# Patient Record
Sex: Male | Born: 1960 | Race: White | Hispanic: No | Marital: Single | Smoking: Current every day smoker
Health system: Southern US, Community
[De-identification: ages and names within clinical notes are randomized; demographics above are authoritative.]

## PROBLEM LIST (undated history)

## (undated) DIAGNOSIS — F1410 Cocaine abuse, uncomplicated: Secondary | ICD-10-CM

## (undated) DIAGNOSIS — Z720 Tobacco use: Secondary | ICD-10-CM

## (undated) HISTORY — PX: ABDOMINAL SURGERY: SHX537

---

## 2004-10-12 ENCOUNTER — Emergency Department: Payer: Self-pay | Admitting: Emergency Medicine

## 2005-06-09 ENCOUNTER — Emergency Department: Payer: Self-pay | Admitting: Emergency Medicine

## 2010-05-18 ENCOUNTER — Emergency Department: Payer: Self-pay | Admitting: Emergency Medicine

## 2012-09-06 ENCOUNTER — Emergency Department: Payer: Self-pay | Admitting: Emergency Medicine

## 2013-04-17 ENCOUNTER — Emergency Department: Payer: Self-pay | Admitting: Emergency Medicine

## 2015-10-08 ENCOUNTER — Encounter: Payer: Self-pay | Admitting: Emergency Medicine

## 2015-10-08 ENCOUNTER — Emergency Department
Admission: EM | Admit: 2015-10-08 | Discharge: 2015-10-08 | Disposition: A | Discharge status: Home / Self Care | Payer: Self-pay | Attending: Emergency Medicine | Admitting: Emergency Medicine

## 2015-10-08 DIAGNOSIS — F17200 Nicotine dependence, unspecified, uncomplicated: Secondary | ICD-10-CM | POA: Insufficient documentation

## 2015-10-08 DIAGNOSIS — K029 Dental caries, unspecified: Secondary | ICD-10-CM | POA: Insufficient documentation

## 2015-10-08 DIAGNOSIS — K047 Periapical abscess without sinus: Secondary | ICD-10-CM

## 2015-10-08 DIAGNOSIS — K0889 Other specified disorders of teeth and supporting structures: Secondary | ICD-10-CM

## 2015-10-08 MED ORDER — OXYCODONE-ACETAMINOPHEN 5-325 MG PO TABS
2.0000 | ORAL_TABLET | Freq: Once | ORAL | Status: AC
  Administered 2015-10-08: 2 via ORAL
  Filled 2015-10-08: qty 2

## 2015-10-08 MED ORDER — AMOXICILLIN 500 MG PO TABS: 500.0000 mg | ORAL_TABLET | Freq: Two times a day (BID) | ORAL | 0 refills | Status: DC

## 2015-10-08 MED ORDER — LIDOCAINE VISCOUS 2 % MT SOLN: 20.0000 mL | OROMUCOSAL | 0 refills | Status: DC | PRN

## 2015-10-08 MED ORDER — LIDOCAINE VISCOUS 2 % MT SOLN
15.0000 mL | Freq: Once | OROMUCOSAL | Status: AC
  Administered 2015-10-08: 15 mL via OROMUCOSAL
  Filled 2015-10-08: qty 15

## 2015-10-08 MED ORDER — OXYCODONE-ACETAMINOPHEN 5-325 MG PO TABS: 1.0000 | ORAL_TABLET | ORAL | 0 refills | Status: DC | PRN

## 2015-10-08 MED ORDER — AMOXICILLIN 500 MG PO CAPS
500.0000 mg | ORAL_CAPSULE | Freq: Once | ORAL | Status: AC
  Administered 2015-10-08: 500 mg via ORAL
  Filled 2015-10-08: qty 1

## 2015-10-08 NOTE — ED Triage Notes (Signed)
Pt c/o abscess tooth since last night, broken teeth noted, pt missing teeth. Pt denies difficulty swallowing or chewing.

## 2015-10-08 NOTE — ED Notes (Addendum)
Pt states "I have an abscessed tooth." Pt states swollen face x 24 hours. Has not seen a dentist. Lower R sided pain. States has taken BC powders, orajel, tylneol with no relief.

## 2015-10-08 NOTE — ED Provider Notes (Signed)
[facility] Emergency Department Provider Note  ____________________________________________  Time seen: Approximately 8:41 PM  I have reviewed the triage vital signs and the nursing notes.   HISTORY  Chief Complaint Dental Pain    HPI Jeffrey Ramsey is a 55 y.o. male presents for evaluation of dental pain and swelling to the right front lower teeth. Discussed pain is 10 over 10.   History reviewed. No pertinent past medical history.  There are no active problems to display for this patient.   Past Surgical History:  Procedure Laterality Date  . ABDOMINAL SURGERY      Prior to Admission medications   Medication Sig Start Date End Date Taking? Authorizing Provider  amoxicillin (AMOXIL) 500 MG tablet Take 1 tablet (500 mg total) by mouth 2 (two) times daily. 10/08/15   Charmayne Sheer Hazim Treadway, PA-C  lidocaine (XYLOCAINE) 2 % solution Use as directed 20 mLs in the mouth or throat as needed for mouth pain. 10/08/15   Evangeline Dakin, PA-C  oxyCODONE-acetaminophen (ROXICET) 5-325 MG tablet Take 1-2 tablets by mouth every 4 (four) hours as needed for severe pain. 10/08/15   Evangeline Dakin, PA-C    Allergies Review of patient's allergies indicates no known allergies.  No family history on file.  Social History Social History  Substance Use Topics  . Smoking status: Current Every Day Smoker    Packs/day: 1.00  . Smokeless tobacco: Never Used  . Alcohol use No    Review of Systems Constitutional: No fever/chills ENT: No sore throat.Positive for dental pain. Cardiovascular: Denies chest pain. Respiratory: Denies shortness of breath. Skin: Negative for rash. Neurological: Negative for headaches, focal weakness or numbness.  10-point ROS otherwise negative.  ____________________________________________   PHYSICAL EXAM:  VITAL SIGNS: ED Triage Vitals  Enc Vitals Group     BP 10/08/15 1953 125/74     Pulse Rate 10/08/15 1953 (!) 103     Resp 10/08/15  1953 18     Temp 10/08/15 1953 98.3 F (36.8 C)     Temp Source 10/08/15 1953 Oral     SpO2 10/08/15 1953 98 %     Weight 10/08/15 1953 175 lb (79.4 kg)     Height 10/08/15 1953 5\' 8"  (1.727 m)     Head Circumference --      Peak Flow --      Pain Score 10/08/15 1954 10     Pain Loc --      Pain Edu? --      Excl. in GC? --     Constitutional: Alert and oriented. Well appearing and in no acute distress. Mouth/Throat: Mucous membranes are moist.  Oropharynx non-erythematous.Obvious dental caries noted with rotted teeth swollen gums on the right lower incisors.  Neck: No stridor.  Supple full range of motion with no cervical adenopathy noted. Cardiovascular: Normal rate, regular rhythm. Grossly normal heart sounds.  Good peripheral circulation. Respiratory: Normal respiratory effort.  No retractions. Lungs CTAB. Neurologic:  Normal speech and language. No gross focal neurologic deficits are appreciated. No gait instability. Skin:  Skin is warm, dry and intact. No rash noted. Psychiatric: Mood and affect are normal. Speech and behavior are normal.  ____________________________________________   LABS (all labs ordered are listed, but only abnormal results are displayed)  Labs Reviewed - No data to display ____________________________________________  EKG   ____________________________________________  RADIOLOGY   ____________________________________________   PROCEDURES  Procedure(s) performed: None  Critical Care performed: No  ____________________________________________   INITIAL  IMPRESSION / ASSESSMENT AND PLAN / ED COURSE  Pertinent labs & imaging results that were available during my care of the patient were reviewed by me and considered in my medical decision making (see chart for details).  Dental caries/abscess. Rx given for amoxicillin/Percocet/Viscous Lidocaine. List of local dental providers given. Patient was looked up in [place] controlled  substance registry with no history noted.  Clinical Course    ____________________________________________   FINAL CLINICAL IMPRESSION(S) / ED DIAGNOSES  Final diagnoses:  Pain, dental  Infected dental caries     This chart was dictated using voice recognition software/Dragon. Despite best efforts to proofread, errors can occur which can change the meaning. Any change was purely unintentional.    Evangeline Dakin, PA-C 10/08/15 4132    Phineas Semen, MD 10/08/15 2156

## 2017-08-29 ENCOUNTER — Emergency Department: Payer: Self-pay

## 2017-08-29 ENCOUNTER — Other Ambulatory Visit: Payer: Self-pay

## 2017-08-29 ENCOUNTER — Encounter: Payer: Self-pay | Admitting: Emergency Medicine

## 2017-08-29 DIAGNOSIS — M25511 Pain in right shoulder: Secondary | ICD-10-CM | POA: Insufficient documentation

## 2017-08-29 DIAGNOSIS — W172XXA Fall into hole, initial encounter: Secondary | ICD-10-CM | POA: Insufficient documentation

## 2017-08-29 DIAGNOSIS — W228XXA Striking against or struck by other objects, initial encounter: Secondary | ICD-10-CM | POA: Insufficient documentation

## 2017-08-29 DIAGNOSIS — Y990 Civilian activity done for income or pay: Secondary | ICD-10-CM | POA: Insufficient documentation

## 2017-08-29 DIAGNOSIS — R55 Syncope and collapse: Secondary | ICD-10-CM | POA: Insufficient documentation

## 2017-08-29 DIAGNOSIS — M7918 Myalgia, other site: Secondary | ICD-10-CM | POA: Insufficient documentation

## 2017-08-29 DIAGNOSIS — H9313 Tinnitus, bilateral: Secondary | ICD-10-CM | POA: Insufficient documentation

## 2017-08-29 DIAGNOSIS — H9202 Otalgia, left ear: Secondary | ICD-10-CM | POA: Insufficient documentation

## 2017-08-29 DIAGNOSIS — F17210 Nicotine dependence, cigarettes, uncomplicated: Secondary | ICD-10-CM | POA: Insufficient documentation

## 2017-08-29 DIAGNOSIS — H538 Other visual disturbances: Secondary | ICD-10-CM | POA: Insufficient documentation

## 2017-08-29 DIAGNOSIS — Y9389 Activity, other specified: Secondary | ICD-10-CM | POA: Insufficient documentation

## 2017-08-29 DIAGNOSIS — S0990XA Unspecified injury of head, initial encounter: Secondary | ICD-10-CM | POA: Insufficient documentation

## 2017-08-29 DIAGNOSIS — R110 Nausea: Secondary | ICD-10-CM | POA: Insufficient documentation

## 2017-08-29 DIAGNOSIS — Y9289 Other specified places as the place of occurrence of the external cause: Secondary | ICD-10-CM | POA: Insufficient documentation

## 2017-08-29 NOTE — ED Triage Notes (Addendum)
Pt presents to ED post head injury. Pt states around 1600 he was struck in the head with a fiberglass instrument used at his job. Pt states it hit him on the left side of his head and knocked him into a hole with +loc. Pt states initially his left ear was ringing and his vision was blurry. Pt states this has resolved. Abrasion noted to left side of pt head. Bleeding controlled. Pt talkative with clear speech and able to answer questions without difficulty. No distress noted.

## 2017-08-30 ENCOUNTER — Emergency Department: Payer: Self-pay

## 2017-08-30 ENCOUNTER — Emergency Department
Admission: EM | Admit: 2017-08-30 | Discharge: 2017-08-30 | Disposition: A | Discharge status: Home / Self Care | Payer: Self-pay | Attending: Emergency Medicine | Admitting: Emergency Medicine

## 2017-08-30 DIAGNOSIS — T148XXA Other injury of unspecified body region, initial encounter: Secondary | ICD-10-CM

## 2017-08-30 DIAGNOSIS — M7918 Myalgia, other site: Secondary | ICD-10-CM

## 2017-08-30 DIAGNOSIS — W19XXXA Unspecified fall, initial encounter: Secondary | ICD-10-CM

## 2017-08-30 DIAGNOSIS — R52 Pain, unspecified: Secondary | ICD-10-CM

## 2017-08-30 DIAGNOSIS — S0990XA Unspecified injury of head, initial encounter: Secondary | ICD-10-CM

## 2017-08-30 MED ORDER — TRAMADOL HCL 50 MG PO TABS
50.0000 mg | ORAL_TABLET | Freq: Once | ORAL | Status: AC
  Administered 2017-08-30: 50 mg via ORAL

## 2017-08-30 MED ORDER — TRAMADOL HCL 50 MG PO TABS: 50.0000 mg | ORAL_TABLET | Freq: Four times a day (QID) | ORAL | 0 refills | Status: DC | PRN

## 2017-08-30 MED ORDER — KETOROLAC TROMETHAMINE 60 MG/2ML IM SOLN
INTRAMUSCULAR | Status: AC
  Filled 2017-08-30: qty 2

## 2017-08-30 MED ORDER — KETOROLAC TROMETHAMINE 60 MG/2ML IM SOLN
60.0000 mg | Freq: Once | INTRAMUSCULAR | Status: AC
  Administered 2017-08-30: 60 mg via INTRAMUSCULAR

## 2017-08-30 MED ORDER — TRAMADOL HCL 50 MG PO TABS
ORAL_TABLET | ORAL | Status: AC
  Filled 2017-08-30: qty 1

## 2017-08-30 NOTE — Discharge Instructions (Addendum)
Please follow up with the acute care clinic for further evaluation of your symptoms. Please return with any worsened condition or any other concerns.

## 2017-08-30 NOTE — ED Provider Notes (Signed)
[facility] Emergency Department Provider Note   ____________________________________________   First MD Initiated Contact with Patient 08/30/17 [phone]     (approximate)  I have reviewed the triage vital signs and the nursing notes.   HISTORY  Chief Complaint Head Injury    HPI Jeffrey Ramsey is a 57 y.o. male who comes into the hospital today with a head injury.  The patient states that he was hit in the head at work earlier today.  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  The patient fell into the ditch and passed out.  He states that he was out for short amount of time but when he came to everything was gray and his vision was blurred.  He had some nausea and some ringing in his ears initially but that has improved.  The patient states that he has some left ear pain and right shoulder pain.  He also has an abrasion to his left scalp and rates his pain a 10 out of 10 in intensity.  He has some jaw pain but went home initially and showered.  He came in as his pain was is getting worse.  This occurred around 3:30 PM.  The patient is here today for evaluation.   History reviewed. No pertinent past medical history.  There are no active problems to display for this patient.   Past Surgical History:  Procedure Laterality Date  . ABDOMINAL SURGERY      Prior to Admission medications   Medication Sig Start Date End Date Taking? Authorizing Provider  amoxicillin (AMOXIL) 500 MG tablet Take 1 tablet (500 mg total) by mouth 2 (two) times daily. 10/08/15   Beers, Charmayne Sheer, PA-C  lidocaine (XYLOCAINE) 2 % solution Use as directed 20 mLs in the mouth or throat as needed for mouth pain. 10/08/15   Beers, Charmayne Sheer, PA-C  oxyCODONE-acetaminophen (ROXICET) 5-325 MG tablet Take 1-2 tablets by mouth every 4 (four) hours as needed for severe pain. 10/08/15   Beers, Charmayne Sheer, PA-C  traMADol (ULTRAM) 50 MG tablet  Take 1 tablet (50 mg total) by mouth every 6 (six) hours as needed. 08/30/17   Rebecka Apley, MD    Allergies Penicillins  No family history on file.  Social History Social History   Tobacco Use  . Smoking status: Current Every Day Smoker    Packs/day: 1.00    Types: Cigarettes  . Smokeless tobacco: Never Used  Substance Use Topics  . Alcohol use: No  . Drug use: Never    Review of Systems  Constitutional: No fever/chills Eyes: No visual changes. ENT: Jaw pain. Cardiovascular: Denies chest pain. Respiratory: Denies shortness of breath. Gastrointestinal: No abdominal pain.  No nausea, no vomiting.  No diarrhea.  No constipation. Genitourinary: Negative for dysuria. Musculoskeletal: Negative for back pain. Skin: Abrasion to left scalp Neurological: Head pain   ____________________________________________   PHYSICAL EXAM:  VITAL SIGNS: ED Triage Vitals  Enc Vitals Group     BP 08/29/17 2110 130/78     Pulse Rate 08/29/17 2110 96     Resp 08/29/17 2110 18     Temp 08/29/17 2110 97.8 F (36.6 C)     Temp Source 08/29/17 2110 Oral     SpO2 08/29/17 2110 96 %     Weight 08/29/17 2112 175 lb (79.4 kg)     Height 08/29/17 2112  5\' 9"  (1.753 m)     Head Circumference --      Peak Flow --      Pain Score 08/29/17 2110 8     Pain Loc --      Pain Edu? --      Excl. in GC? --     Constitutional: Alert and oriented. Well appearing and in mild distress. Eyes: Conjunctivae are normal. PERRL. EOMI. Head: Abrasion to left scalp.  Bruising to left tragus Nose: No congestion/rhinnorhea. Mouth/Throat: Mucous membranes are moist.  Oropharynx non-erythematous. Neck: No cervical spine tenderness to palpation.  Tenderness to palpation of left jaw  Cardiovascular: Normal rate, regular rhythm. Grossly normal heart sounds.  Good peripheral circulation. Respiratory: Normal respiratory effort.  No retractions. Lungs CTAB. Gastrointestinal: Soft and nontender. No distention.   Positive bowel sounds Musculoskeletal: Mild tenderness to palpation of right shoulder no significant pain with range of motion Neurologic:  Normal speech and language.  Skin:  Skin is warm, dry and intact. Marland Kitchen. Psychiatric: Mood and affect are normal.   ____________________________________________   LABS (all labs ordered are listed, but only abnormal results are displayed)  Labs Reviewed - No data to display ____________________________________________  EKG  none ____________________________________________  RADIOLOGY  ED MD interpretation:   CT head: No acute intracranial pathology  CT maxillofacial: No acute traumatic facial bone fractures  DG cervical spine: No acute traumatic cervical spine pathology by radiograph  DG right shoulder: Negative  Official radiology report(s): Dg Cervical Spine 2-3 Views  Result Date: 08/30/2017 CLINICAL DATA:  57 year old male with fall and neck pain. EXAM: CERVICAL SPINE - 2-3 VIEW COMPARISON:  None. FINDINGS: There is no acute fracture or subluxation of the cervical spine. There multilevel degenerative changes primarily at C4-C5 and C5-C6. The visualized posterior elements and the odontoid appear intact. There is anatomic alignment of the lateral masses of C1 and C2. The soft tissues appear unremarkable. IMPRESSION: No acute/traumatic cervical spine pathology by radiograph. Electronically Signed   By: Elgie CollardArash  Radparvar M.D.   On: 08/30/2017 02:21   Dg Shoulder Right  Result Date: 08/30/2017 CLINICAL DATA:  57 year old male with trauma to the right shoulder. EXAM: RIGHT SHOULDER - 2+ VIEW COMPARISON:  None. FINDINGS: There is no evidence of fracture or dislocation. There is no evidence of arthropathy or other focal bone abnormality. Soft tissues are unremarkable. IMPRESSION: Negative. Electronically Signed   By: Elgie CollardArash  Radparvar M.D.   On: 08/30/2017 02:19   Ct Head Wo Contrast  Result Date: 08/29/2017 CLINICAL DATA:  57 year old male with  head trauma. EXAM: CT HEAD WITHOUT CONTRAST TECHNIQUE: Contiguous axial images were obtained from the base of the skull through the vertex without intravenous contrast. COMPARISON:  None. FINDINGS: Brain: No evidence of acute infarction, hemorrhage, hydrocephalus, extra-axial collection or mass lesion/mass effect. Vascular: No hyperdense vessel or unexpected calcification. Skull: Normal. Negative for fracture or focal lesion. Sinuses/Orbits: There is mild mucoperiosteal thickening of paranasal sinuses. No air-fluid levels. The mastoid air cells are clear. Other: None IMPRESSION: No acute intracranial pathology. Electronically Signed   By: Elgie CollardArash  Radparvar M.D.   On: 08/29/2017 22:05   Ct Maxillofacial Wo Contrast  Result Date: 08/30/2017 CLINICAL DATA:  57 year old male with trauma to the face. EXAM: CT MAXILLOFACIAL WITHOUT CONTRAST TECHNIQUE: Multidetector CT imaging of the maxillofacial structures was performed. Multiplanar CT image reconstructions were also generated. COMPARISON:  None. FINDINGS: Osseous: No acute fracture or mandibular dislocation. There multiple dental caries. Orbits: Negative. No traumatic or inflammatory finding. Sinuses: Diffuse  mucoperiosteal thickening of paranasal sinuses. No air-fluid levels. The mastoid air cells are clear as visualized. Soft tissues: Negative. Limited intracranial: No significant or unexpected finding. IMPRESSION: No acute/traumatic facial bone fractures. Electronically Signed   By: Elgie Collard M.D.   On: 08/30/2017 02:28    ____________________________________________   PROCEDURES  Procedure(s) performed: None  Procedures  Critical Care performed: No  ____________________________________________   INITIAL IMPRESSION / ASSESSMENT AND PLAN / ED COURSE  As part of my medical decision making, I reviewed the following data within the electronic MEDICAL RECORD NUMBER Notes from prior ED visits and [place] Controlled Substance Database   This is a  57 year old male who comes into the hospital today after being hit in the head at work.  He still having some pain so his concern is for any acute injury.  We did send the patient for CT scan of his head and maxillofacial area.  The patient also had an x-ray of his right shoulder and cervical spine.  The patient's imaging studies are unremarkable.  He did receive a shot of Toradol and some tramadol for pain.  He will be discharged home.  We did place some Steri-Strips over his abrasion.  He has no other complaints at this time and he will be discharged home.      ____________________________________________   FINAL CLINICAL IMPRESSION(S) / ED DIAGNOSES  Final diagnoses:  Injury of head, initial encounter  Abrasion  Musculoskeletal pain     ED Discharge Orders        Ordered    traMADol (ULTRAM) 50 MG tablet  Every 6 hours PRN     08/30/17 0233       Note:  This document was prepared using Dragon voice recognition software and may include unintentional dictation errors.    Rebecka Apley, MD 08/30/17 [phone]

## 2017-08-30 NOTE — ED Notes (Signed)
Pt was at work and was hit by a stick instrument that was in the hole they were digging. Ringing in his left ear and a cut at his temple and a stiff jaw. Also fell onto R shoulder and it is stiff and the muscles are sore.

## 2020-08-14 ENCOUNTER — Other Ambulatory Visit: Payer: Self-pay

## 2020-08-14 ENCOUNTER — Emergency Department (HOSPITAL_COMMUNITY): Payer: No Typology Code available for payment source

## 2020-08-14 ENCOUNTER — Inpatient Hospital Stay (HOSPITAL_COMMUNITY)
Admission: EM | Admit: 2020-08-14 | Discharge: 2020-08-19 | DRG: 982 | Disposition: A | Discharge status: Home / Self Care | Payer: No Typology Code available for payment source | Attending: Surgery | Admitting: Surgery

## 2020-08-14 DIAGNOSIS — S32810A Multiple fractures of pelvis with stable disruption of pelvic ring, initial encounter for closed fracture: Secondary | ICD-10-CM | POA: Diagnosis present

## 2020-08-14 DIAGNOSIS — Y92410 Unspecified street and highway as the place of occurrence of the external cause: Secondary | ICD-10-CM

## 2020-08-14 DIAGNOSIS — S82042A Displaced comminuted fracture of left patella, initial encounter for closed fracture: Secondary | ICD-10-CM

## 2020-08-14 DIAGNOSIS — R319 Hematuria, unspecified: Secondary | ICD-10-CM | POA: Diagnosis present

## 2020-08-14 DIAGNOSIS — T1490XA Injury, unspecified, initial encounter: Secondary | ICD-10-CM | POA: Diagnosis not present

## 2020-08-14 DIAGNOSIS — F17210 Nicotine dependence, cigarettes, uncomplicated: Secondary | ICD-10-CM | POA: Diagnosis present

## 2020-08-14 DIAGNOSIS — S2249XA Multiple fractures of ribs, unspecified side, initial encounter for closed fracture: Secondary | ICD-10-CM

## 2020-08-14 DIAGNOSIS — Z419 Encounter for procedure for purposes other than remedying health state, unspecified: Secondary | ICD-10-CM

## 2020-08-14 DIAGNOSIS — S2241XA Multiple fractures of ribs, right side, initial encounter for closed fracture: Principal | ICD-10-CM | POA: Diagnosis present

## 2020-08-14 DIAGNOSIS — S32119A Unspecified Zone I fracture of sacrum, initial encounter for closed fracture: Secondary | ICD-10-CM | POA: Diagnosis present

## 2020-08-14 DIAGNOSIS — D62 Acute posthemorrhagic anemia: Secondary | ICD-10-CM | POA: Diagnosis not present

## 2020-08-14 DIAGNOSIS — T148XXA Other injury of unspecified body region, initial encounter: Secondary | ICD-10-CM

## 2020-08-14 DIAGNOSIS — Z20822 Contact with and (suspected) exposure to covid-19: Secondary | ICD-10-CM | POA: Diagnosis present

## 2020-08-14 DIAGNOSIS — Z79891 Long term (current) use of opiate analgesic: Secondary | ICD-10-CM

## 2020-08-14 DIAGNOSIS — Z880 Allergy status to penicillin: Secondary | ICD-10-CM

## 2020-08-14 DIAGNOSIS — S329XXA Fracture of unspecified parts of lumbosacral spine and pelvis, initial encounter for closed fracture: Secondary | ICD-10-CM | POA: Diagnosis present

## 2020-08-14 DIAGNOSIS — M79671 Pain in right foot: Secondary | ICD-10-CM

## 2020-08-14 DIAGNOSIS — Z79899 Other long term (current) drug therapy: Secondary | ICD-10-CM

## 2020-08-14 DIAGNOSIS — M25571 Pain in right ankle and joints of right foot: Secondary | ICD-10-CM

## 2020-08-14 LAB — COMPREHENSIVE METABOLIC PANEL
ALT: 39 U/L (ref 0–44)
AST: 48 U/L — ABNORMAL HIGH (ref 15–41)
Albumin: 4.1 g/dL (ref 3.5–5.0)
Alkaline Phosphatase: 69 U/L (ref 38–126)
Anion gap: 9 (ref 5–15)
BUN: 22 mg/dL — ABNORMAL HIGH (ref 6–20)
CO2: 24 mmol/L (ref 22–32)
Calcium: 9 mg/dL (ref 8.9–10.3)
Chloride: 105 mmol/L (ref 98–111)
Creatinine, Ser: 1.4 mg/dL — ABNORMAL HIGH (ref 0.61–1.24)
GFR, Estimated: 58 mL/min — ABNORMAL LOW (ref >60)
Glucose, Bld: 97 mg/dL (ref 70–99)
Potassium: 3.9 mmol/L (ref 3.5–5.1)
Sodium: 138 mmol/L (ref 135–145)
Total Bilirubin: 0.7 mg/dL (ref 0.3–1.2)
Total Protein: 6.8 g/dL (ref 6.5–8.1)

## 2020-08-14 LAB — CBC
HCT: 46.3 % (ref 39.0–52.0)
Hemoglobin: 15.4 g/dL (ref 13.0–17.0)
MCH: 31.1 pg (ref 26.0–34.0)
MCHC: 33.3 g/dL (ref 30.0–36.0)
MCV: 93.5 fL (ref 80.0–100.0)
Platelets: 237 10*3/uL (ref 150–400)
RBC: 4.95 MIL/uL (ref 4.22–5.81)
RDW: 12.1 % (ref 11.5–15.5)
WBC: 17.8 10*3/uL — ABNORMAL HIGH (ref 4.0–10.5)
nRBC: 0 % (ref 0.0–0.2)

## 2020-08-14 LAB — I-STAT CHEM 8, ED
BUN: 31 mg/dL — ABNORMAL HIGH (ref 6–20)
Calcium, Ion: 1.13 mmol/L — ABNORMAL LOW (ref 1.15–1.40)
Chloride: 106 mmol/L (ref 98–111)
Creatinine, Ser: 1.3 mg/dL — ABNORMAL HIGH (ref 0.61–1.24)
Glucose, Bld: 93 mg/dL (ref 70–99)
HCT: 46 % (ref 39.0–52.0)
Hemoglobin: 15.6 g/dL (ref 13.0–17.0)
Potassium: 4.1 mmol/L (ref 3.5–5.1)
Sodium: 142 mmol/L (ref 135–145)
TCO2: 27 mmol/L (ref 22–32)

## 2020-08-14 LAB — SAMPLE TO BLOOD BANK

## 2020-08-14 LAB — PROTIME-INR
INR: 1 (ref 0.8–1.2)
Prothrombin Time: 13.2 seconds (ref 11.4–15.2)

## 2020-08-14 LAB — ETHANOL: Alcohol, Ethyl (B): <10 mg/dL (ref <10)

## 2020-08-14 LAB — RESP PANEL BY RT-PCR (FLU A&B, COVID) ARPGX2
Influenza A by PCR: NEGATIVE
Influenza B by PCR: NEGATIVE
SARS Coronavirus 2 by RT PCR: NEGATIVE

## 2020-08-14 MED ORDER — IOHEXOL 300 MG/ML  SOLN
75.0000 mL | Freq: Once | INTRAMUSCULAR | Status: AC | PRN
  Administered 2020-08-14: 75 mL via INTRAVENOUS

## 2020-08-14 MED ORDER — ONDANSETRON HCL 4 MG/2ML IJ SOLN
4.0000 mg | Freq: Once | INTRAMUSCULAR | Status: AC
  Administered 2020-08-14: 4 mg via INTRAVENOUS
  Filled 2020-08-14: qty 2

## 2020-08-14 MED ORDER — SODIUM CHLORIDE 0.9 % IV BOLUS
125.0000 mL | Freq: Once | INTRAVENOUS | Status: AC
  Administered 2020-08-14: 125 mL via INTRAVENOUS

## 2020-08-14 MED ORDER — MORPHINE SULFATE (PF) 4 MG/ML IV SOLN
4.0000 mg | Freq: Once | INTRAVENOUS | Status: AC
  Administered 2020-08-14: 4 mg via INTRAVENOUS
  Filled 2020-08-14: qty 1
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
## 2020-08-14 NOTE — ED Notes (Signed)
Sister Gerhard Perches [phone] would like an update immediately

## 2020-08-15 ENCOUNTER — Encounter (HOSPITAL_COMMUNITY): Payer: Self-pay

## 2020-08-15 ENCOUNTER — Inpatient Hospital Stay (HOSPITAL_COMMUNITY): Payer: No Typology Code available for payment source

## 2020-08-15 DIAGNOSIS — S32119A Unspecified Zone I fracture of sacrum, initial encounter for closed fracture: Secondary | ICD-10-CM | POA: Diagnosis present

## 2020-08-15 DIAGNOSIS — Z79891 Long term (current) use of opiate analgesic: Secondary | ICD-10-CM | POA: Diagnosis not present

## 2020-08-15 DIAGNOSIS — S32810A Multiple fractures of pelvis with stable disruption of pelvic ring, initial encounter for closed fracture: Secondary | ICD-10-CM | POA: Diagnosis present

## 2020-08-15 DIAGNOSIS — S82042A Displaced comminuted fracture of left patella, initial encounter for closed fracture: Secondary | ICD-10-CM | POA: Diagnosis present

## 2020-08-15 DIAGNOSIS — R319 Hematuria, unspecified: Secondary | ICD-10-CM | POA: Diagnosis present

## 2020-08-15 DIAGNOSIS — S329XXA Fracture of unspecified parts of lumbosacral spine and pelvis, initial encounter for closed fracture: Secondary | ICD-10-CM | POA: Diagnosis present

## 2020-08-15 DIAGNOSIS — T1490XA Injury, unspecified, initial encounter: Secondary | ICD-10-CM | POA: Diagnosis present

## 2020-08-15 DIAGNOSIS — F17210 Nicotine dependence, cigarettes, uncomplicated: Secondary | ICD-10-CM | POA: Diagnosis present

## 2020-08-15 DIAGNOSIS — D62 Acute posthemorrhagic anemia: Secondary | ICD-10-CM | POA: Diagnosis not present

## 2020-08-15 DIAGNOSIS — Y92410 Unspecified street and highway as the place of occurrence of the external cause: Secondary | ICD-10-CM | POA: Diagnosis not present

## 2020-08-15 DIAGNOSIS — Z79899 Other long term (current) drug therapy: Secondary | ICD-10-CM | POA: Diagnosis not present

## 2020-08-15 DIAGNOSIS — S2241XA Multiple fractures of ribs, right side, initial encounter for closed fracture: Secondary | ICD-10-CM | POA: Diagnosis present

## 2020-08-15 DIAGNOSIS — Z20822 Contact with and (suspected) exposure to covid-19: Secondary | ICD-10-CM | POA: Diagnosis present

## 2020-08-15 DIAGNOSIS — Z880 Allergy status to penicillin: Secondary | ICD-10-CM | POA: Diagnosis not present

## 2020-08-15 LAB — BASIC METABOLIC PANEL
Anion gap: 5 (ref 5–15)
BUN: 17 mg/dL (ref 6–20)
CO2: 26 mmol/L (ref 22–32)
Calcium: 8.1 mg/dL — ABNORMAL LOW (ref 8.9–10.3)
Chloride: 106 mmol/L (ref 98–111)
Creatinine, Ser: 1.21 mg/dL (ref 0.61–1.24)
GFR, Estimated: >60 mL/min (ref >60)
Glucose, Bld: 119 mg/dL — ABNORMAL HIGH (ref 70–99)
Potassium: 4.2 mmol/L (ref 3.5–5.1)
Sodium: 137 mmol/L (ref 135–145)

## 2020-08-15 LAB — URINALYSIS, ROUTINE W REFLEX MICROSCOPIC
Bacteria, UA: NONE SEEN
Bilirubin Urine: NEGATIVE
Glucose, UA: NEGATIVE mg/dL
Ketones, ur: NEGATIVE mg/dL
Leukocytes,Ua: NEGATIVE
Nitrite: NEGATIVE
Protein, ur: 100 mg/dL — AB
RBC / HPF: >50 RBC/hpf — ABNORMAL HIGH (ref 0–5)
Specific Gravity, Urine: 1.032 — ABNORMAL HIGH (ref 1.005–1.030)
pH: 5 (ref 5.0–8.0)

## 2020-08-15 LAB — HEMOGLOBIN AND HEMATOCRIT, BLOOD
HCT: 36.9 % — ABNORMAL LOW (ref 39.0–52.0)
HCT: 37.7 % — ABNORMAL LOW (ref 39.0–52.0)
HCT: 40.8 % (ref 39.0–52.0)
Hemoglobin: 12.3 g/dL — ABNORMAL LOW (ref 13.0–17.0)
Hemoglobin: 12.6 g/dL — ABNORMAL LOW (ref 13.0–17.0)
Hemoglobin: 13.6 g/dL (ref 13.0–17.0)

## 2020-08-15 LAB — SURGICAL PCR SCREEN
MRSA, PCR: NEGATIVE
Staphylococcus aureus: POSITIVE — AB

## 2020-08-15 LAB — CBC
HCT: 36.9 % — ABNORMAL LOW (ref 39.0–52.0)
Hemoglobin: 12.5 g/dL — ABNORMAL LOW (ref 13.0–17.0)
MCH: 31 pg (ref 26.0–34.0)
MCHC: 33.9 g/dL (ref 30.0–36.0)
MCV: 91.6 fL (ref 80.0–100.0)
Platelets: 173 10*3/uL (ref 150–400)
RBC: 4.03 MIL/uL — ABNORMAL LOW (ref 4.22–5.81)
RDW: 12.3 % (ref 11.5–15.5)
WBC: 10.4 10*3/uL (ref 4.0–10.5)
nRBC: 0 % (ref 0.0–0.2)

## 2020-08-15 LAB — HIV ANTIBODY (ROUTINE TESTING W REFLEX): HIV Screen 4th Generation wRfx: NONREACTIVE

## 2020-08-15 MED ORDER — DOCUSATE SODIUM 100 MG PO CAPS
100.0000 mg | ORAL_CAPSULE | Freq: Two times a day (BID) | ORAL | Status: DC
  Administered 2020-08-15 – 2020-08-18 (×7): 100 mg via ORAL
  Filled 2020-08-15 (×7): qty 1

## 2020-08-15 MED ORDER — OXYCODONE HCL 5 MG PO TABS
5.0000 mg | ORAL_TABLET | ORAL | Status: DC | PRN
  Administered 2020-08-15 – 2020-08-17 (×8): 5 mg via ORAL
  Filled 2020-08-15 (×8): qty 1

## 2020-08-15 MED ORDER — SODIUM CHLORIDE 0.9 % IV SOLN: INTRAVENOUS | Status: DC

## 2020-08-15 MED ORDER — PNEUMOCOCCAL VAC POLYVALENT 25 MCG/0.5ML IJ INJ
0.5000 mL | INJECTION | INTRAMUSCULAR | Status: DC
  Filled 2020-08-15: qty 0.5

## 2020-08-15 MED ORDER — MORPHINE SULFATE (PF) 2 MG/ML IV SOLN
2.0000 mg | INTRAVENOUS | Status: DC | PRN
Start: 2020-08-15 — End: 2020-08-18
  Administered 2020-08-15 (×3): 4 mg via INTRAVENOUS
  Administered 2020-08-16 – 2020-08-18 (×3): 2 mg via INTRAVENOUS
  Administered 2020-08-18: 4 mg via INTRAVENOUS
  Administered 2020-08-18: 2 mg via INTRAVENOUS
  Filled 2020-08-15 (×2): qty 2
  Filled 2020-08-15 (×3): qty 1
  Filled 2020-08-15 (×2): qty 2
  Filled 2020-08-15: qty 1

## 2020-08-15 MED ORDER — ONDANSETRON 4 MG PO TBDP: 4.0000 mg | ORAL_TABLET | Freq: Four times a day (QID) | ORAL | Status: DC | PRN

## 2020-08-15 MED ORDER — CHLORHEXIDINE GLUCONATE CLOTH 2 % EX PADS
6.0000 | MEDICATED_PAD | Freq: Every day | CUTANEOUS | Status: AC
  Administered 2020-08-15 – 2020-08-19 (×5): 6 via TOPICAL

## 2020-08-15 MED ORDER — MUPIROCIN 2 % EX OINT
1.0000 "application " | TOPICAL_OINTMENT | Freq: Two times a day (BID) | CUTANEOUS | Status: DC
  Administered 2020-08-15 – 2020-08-19 (×9): 1 via NASAL
  Filled 2020-08-15 (×6): qty 22

## 2020-08-15 MED ORDER — KETOROLAC TROMETHAMINE 30 MG/ML IJ SOLN
30.0000 mg | Freq: Three times a day (TID) | INTRAMUSCULAR | Status: DC | PRN
  Administered 2020-08-15 – 2020-08-19 (×9): 30 mg via INTRAVENOUS
  Filled 2020-08-15 (×9): qty 1

## 2020-08-15 MED ORDER — METOPROLOL TARTRATE 5 MG/5ML IV SOLN: 5.0000 mg | Freq: Four times a day (QID) | INTRAVENOUS | Status: DC | PRN

## 2020-08-15 MED ORDER — ACETAMINOPHEN 325 MG PO TABS: 650.0000 mg | ORAL_TABLET | ORAL | Status: DC | PRN

## 2020-08-15 MED ORDER — ADULT MULTIVITAMIN W/MINERALS CH
1.0000 | ORAL_TABLET | Freq: Every day | ORAL | Status: DC
  Administered 2020-08-15 – 2020-08-19 (×5): 1 via ORAL
  Filled 2020-08-15 (×5): qty 1

## 2020-08-15 MED ORDER — METHOCARBAMOL 750 MG PO TABS
750.0000 mg | ORAL_TABLET | Freq: Three times a day (TID) | ORAL | Status: DC
  Administered 2020-08-15 – 2020-08-19 (×14): 750 mg via ORAL
  Filled 2020-08-15 (×14): qty 1

## 2020-08-15 MED ORDER — ENSURE ENLIVE PO LIQD
237.0000 mL | Freq: Three times a day (TID) | ORAL | Status: DC
  Administered 2020-08-15 – 2020-08-19 (×9): 237 mL via ORAL

## 2020-08-15 MED ORDER — BOOST / RESOURCE BREEZE PO LIQD CUSTOM
1.0000 | Freq: Three times a day (TID) | ORAL | Status: DC
  Administered 2020-08-15: 1 via ORAL

## 2020-08-15 MED ORDER — ONDANSETRON HCL 4 MG/2ML IJ SOLN
4.0000 mg | Freq: Four times a day (QID) | INTRAMUSCULAR | Status: DC | PRN
  Administered 2020-08-15 – 2020-08-18 (×4): 4 mg via INTRAVENOUS
  Filled 2020-08-15 (×4): qty 2

## 2020-08-15 NOTE — Progress Notes (Signed)
Patients urine is turing darker brown and looks like old blood in his urine. This is a change from this morning. Urology wants to put a foley in but patient wants to be sedated. Informed doctor on call and he said he will not be sedated for a foley. We will monitor his output and color for any change. Orthopedics will evaluate on Monday to determine if he needs surgery.
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
## 2020-08-15 NOTE — TOC CAGE-AID Note (Signed)
Transition of Care [facility]) - CAGE-AID Screening   Patient Details  Name: Jeffrey Ramsey MRN: [id number] Date of Birth: [birth year]  Transition of Care [facility]) CM/SW Contact:    Katha Hamming, RN Phone Number: [phone] 08/15/2020, 1:03 AM   Clinical Narrative:  Pt denies alcohol and drug use.  CAGE-AID Screening:    Have You Ever Felt You Ought to Cut Down on Your Drinking or Drug Use?: No Have People Annoyed You By Critizing Your Drinking Or Drug Use?: No Have You Felt Bad Or Guilty About Your Drinking Or Drug Use?: No Have You Ever Had a Drink or Used Drugs First Thing In The Morning to Steady Your Nerves or to Get Rid of a Hangover?: No CAGE-AID Score: 0  Substance Abuse Education Offered: No (Pt reports no alcohol or drug use, no resources needed)

## 2020-08-15 NOTE — Progress Notes (Signed)
Inpatient Rehab Admissions Coordinator Note:   Per therapy recommendations, pt was screened for CIR candidacy by Estill Dooms, PT, DPT.  At this time note possibility of L patellar fx ORIF.  Will rescreen pt on Monday afternoon if he does not have surgery, or on Tuesday if he does.  Please contact me with questions.   Estill Dooms, PT, DPT [phone] 08/15/20 3:53 PM
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
## 2020-08-15 NOTE — Progress Notes (Signed)
CT called wanting to know if catheter had been inserted; explained it was inserted in ED, however when they tried to inflate it patient was unable to tolerate procedure.  RN spoke with patient about attempting to reinsert and patient does not want to try at this time, "maybe in the morning."

## 2020-08-15 NOTE — Consult Note (Signed)
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 Surgery [phone] 08/15/2020, 9:27 AM

## 2020-08-15 NOTE — ED Notes (Signed)
Attempted to insert 3-Way foley catheter for CT CYSTOGRAM. Had some resistance during insertion. Patient verbalized pain while insertion. Good urine return noticed. Resistance noted while attempting to inflate the balloon, patient in obvious pain and requesting to remove the catheter. Catheter removed and small blood clot noted at the tip of the catheter. Kinsinger, MD has been notified. Kinsinger, MD states "take the patient upstairs and we'll deal with it up there".

## 2020-08-15 NOTE — ED Notes (Signed)
Attempted to give report x1. Per Charge RN she needs to verify to make sure patient is appropriate. Charge RN to call back with an answer.

## 2020-08-16 LAB — CBC
HCT: 34.7 % — ABNORMAL LOW (ref 39.0–52.0)
Hemoglobin: 11.5 g/dL — ABNORMAL LOW (ref 13.0–17.0)
MCH: 31.1 pg (ref 26.0–34.0)
MCHC: 33.1 g/dL (ref 30.0–36.0)
MCV: 93.8 fL (ref 80.0–100.0)
Platelets: 145 10*3/uL — ABNORMAL LOW (ref 150–400)
RBC: 3.7 MIL/uL — ABNORMAL LOW (ref 4.22–5.81)
RDW: 12.4 % (ref 11.5–15.5)
WBC: 9.2 10*3/uL (ref 4.0–10.5)
nRBC: 0 % (ref 0.0–0.2)

## 2020-08-16 LAB — BASIC METABOLIC PANEL
Anion gap: 7 (ref 5–15)
BUN: 20 mg/dL (ref 6–20)
CO2: 25 mmol/L (ref 22–32)
Calcium: 8 mg/dL — ABNORMAL LOW (ref 8.9–10.3)
Chloride: 105 mmol/L (ref 98–111)
Creatinine, Ser: 1.02 mg/dL (ref 0.61–1.24)
GFR, Estimated: >60 mL/min (ref >60)
Glucose, Bld: 109 mg/dL — ABNORMAL HIGH (ref 70–99)
Potassium: 4 mmol/L (ref 3.5–5.1)
Sodium: 137 mmol/L (ref 135–145)

## 2020-08-16 MED ORDER — ENOXAPARIN SODIUM 30 MG/0.3ML IJ SOSY
30.0000 mg | PREFILLED_SYRINGE | Freq: Two times a day (BID) | INTRAMUSCULAR | Status: DC
  Administered 2020-08-16 – 2020-08-19 (×7): 30 mg via SUBCUTANEOUS
  Filled 2020-08-16 (×7): qty 0.3
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
## 2020-08-17 LAB — CBC
HCT: 32.1 % — ABNORMAL LOW (ref 39.0–52.0)
Hemoglobin: 10.9 g/dL — ABNORMAL LOW (ref 13.0–17.0)
MCH: 31.1 pg (ref 26.0–34.0)
MCHC: 34 g/dL (ref 30.0–36.0)
MCV: 91.7 fL (ref 80.0–100.0)
Platelets: 146 10*3/uL — ABNORMAL LOW (ref 150–400)
RBC: 3.5 MIL/uL — ABNORMAL LOW (ref 4.22–5.81)
RDW: 12.3 % (ref 11.5–15.5)
WBC: 6.7 10*3/uL (ref 4.0–10.5)
nRBC: 0 % (ref 0.0–0.2)

## 2020-08-17 LAB — BASIC METABOLIC PANEL
Anion gap: 4 — ABNORMAL LOW (ref 5–15)
BUN: 16 mg/dL (ref 6–20)
CO2: 26 mmol/L (ref 22–32)
Calcium: 7.8 mg/dL — ABNORMAL LOW (ref 8.9–10.3)
Chloride: 109 mmol/L (ref 98–111)
Creatinine, Ser: 0.95 mg/dL (ref 0.61–1.24)
GFR, Estimated: >60 mL/min (ref >60)
Glucose, Bld: 91 mg/dL (ref 70–99)
Potassium: 4 mmol/L (ref 3.5–5.1)
Sodium: 139 mmol/L (ref 135–145)

## 2020-08-17 MED ORDER — POLYETHYLENE GLYCOL 3350 17 G PO PACK
17.0000 g | PACK | Freq: Every day | ORAL | Status: DC
  Administered 2020-08-17 – 2020-08-19 (×3): 17 g via ORAL
  Filled 2020-08-17 (×4): qty 1

## 2020-08-17 NOTE — Progress Notes (Signed)
Pt given incentive spirometer and educated on it's use and purpose. Pt compliant and able to pull up to 2750.   Robina Ade, RN
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
## 2020-08-18 ENCOUNTER — Encounter (HOSPITAL_COMMUNITY): Admission: EM | Disposition: A | Discharge status: Home / Self Care | Payer: Self-pay | Source: Home / Self Care

## 2020-08-18 ENCOUNTER — Inpatient Hospital Stay (HOSPITAL_COMMUNITY): Payer: No Typology Code available for payment source

## 2020-08-18 ENCOUNTER — Inpatient Hospital Stay (HOSPITAL_COMMUNITY): Payer: No Typology Code available for payment source | Admitting: Anesthesiology

## 2020-08-18 ENCOUNTER — Encounter (HOSPITAL_COMMUNITY): Payer: Self-pay

## 2020-08-18 HISTORY — PX: ORIF PATELLA: SHX5033

## 2020-08-18 LAB — CBC
HCT: 34 % — ABNORMAL LOW (ref 39.0–52.0)
Hemoglobin: 11.5 g/dL — ABNORMAL LOW (ref 13.0–17.0)
MCH: 31.2 pg (ref 26.0–34.0)
MCHC: 33.8 g/dL (ref 30.0–36.0)
MCV: 92.1 fL (ref 80.0–100.0)
Platelets: 170 10*3/uL (ref 150–400)
RBC: 3.69 MIL/uL — ABNORMAL LOW (ref 4.22–5.81)
RDW: 12.1 % (ref 11.5–15.5)
WBC: 7.5 10*3/uL (ref 4.0–10.5)
nRBC: 0 % (ref 0.0–0.2)

## 2020-08-18 LAB — BASIC METABOLIC PANEL
Anion gap: 5 (ref 5–15)
BUN: 13 mg/dL (ref 6–20)
CO2: 28 mmol/L (ref 22–32)
Calcium: 8.1 mg/dL — ABNORMAL LOW (ref 8.9–10.3)
Chloride: 105 mmol/L (ref 98–111)
Creatinine, Ser: 0.94 mg/dL (ref 0.61–1.24)
GFR, Estimated: >60 mL/min (ref >60)
Glucose, Bld: 90 mg/dL (ref 70–99)
Potassium: 4.4 mmol/L (ref 3.5–5.1)
Sodium: 138 mmol/L (ref 135–145)

## 2020-08-18 SURGERY — OPEN REDUCTION INTERNAL FIXATION (ORIF) PATELLA
Anesthesia: Regional | Site: Knee | Laterality: Left

## 2020-08-18 MED ORDER — PROMETHAZINE HCL 25 MG/ML IJ SOLN: 6.2500 mg | INTRAMUSCULAR | Status: DC | PRN

## 2020-08-18 MED ORDER — METOCLOPRAMIDE HCL 5 MG/ML IJ SOLN: 5.0000 mg | Freq: Three times a day (TID) | INTRAMUSCULAR | Status: DC | PRN

## 2020-08-18 MED ORDER — OXYCODONE HCL 5 MG/5ML PO SOLN: 5.0000 mg | Freq: Once | ORAL | Status: DC | PRN

## 2020-08-18 MED ORDER — VANCOMYCIN HCL 1000 MG IV SOLR
INTRAVENOUS | Status: AC
  Filled 2020-08-18: qty 1000

## 2020-08-18 MED ORDER — ACETAMINOPHEN 10 MG/ML IV SOLN
INTRAVENOUS | Status: DC | PRN
  Administered 2020-08-18: 1000 mg via INTRAVENOUS

## 2020-08-18 MED ORDER — VANCOMYCIN HCL 1000 MG IV SOLR
INTRAVENOUS | Status: DC | PRN
  Administered 2020-08-18: 1000 mg via TOPICAL

## 2020-08-18 MED ORDER — LIDOCAINE HCL (PF) 2 % IJ SOLN
INTRAMUSCULAR | Status: AC
  Filled 2020-08-18: qty 5

## 2020-08-18 MED ORDER — LACTATED RINGERS IV SOLN: INTRAVENOUS | Status: DC | PRN

## 2020-08-18 MED ORDER — ROCURONIUM BROMIDE 10 MG/ML (PF) SYRINGE
PREFILLED_SYRINGE | INTRAVENOUS | Status: AC
  Filled 2020-08-18: qty 10

## 2020-08-18 MED ORDER — ALBUTEROL SULFATE HFA 108 (90 BASE) MCG/ACT IN AERS
INHALATION_SPRAY | RESPIRATORY_TRACT | Status: DC | PRN
  Administered 2020-08-18: 4 via RESPIRATORY_TRACT

## 2020-08-18 MED ORDER — PROPOFOL 10 MG/ML IV BOLUS
INTRAVENOUS | Status: AC
  Filled 2020-08-18: qty 20

## 2020-08-18 MED ORDER — POLYETHYLENE GLYCOL 3350 17 G PO PACK: 17.0000 g | PACK | Freq: Every day | ORAL | Status: DC | PRN

## 2020-08-18 MED ORDER — ONDANSETRON HCL 4 MG/2ML IJ SOLN
INTRAMUSCULAR | Status: DC | PRN
  Administered 2020-08-18: 4 mg via INTRAVENOUS

## 2020-08-18 MED ORDER — PHENYLEPHRINE 40 MCG/ML (10ML) SYRINGE FOR IV PUSH (FOR BLOOD PRESSURE SUPPORT)
PREFILLED_SYRINGE | INTRAVENOUS | Status: AC
  Filled 2020-08-18: qty 10

## 2020-08-18 MED ORDER — PROPOFOL 10 MG/ML IV BOLUS
INTRAVENOUS | Status: DC | PRN
  Administered 2020-08-18: 100 mg via INTRAVENOUS

## 2020-08-18 MED ORDER — ACETAMINOPHEN 10 MG/ML IV SOLN
INTRAVENOUS | Status: AC
  Filled 2020-08-18: qty 100

## 2020-08-18 MED ORDER — DOCUSATE SODIUM 100 MG PO CAPS
100.0000 mg | ORAL_CAPSULE | Freq: Two times a day (BID) | ORAL | Status: DC
  Administered 2020-08-18 – 2020-08-19 (×2): 100 mg via ORAL
  Filled 2020-08-18 (×2): qty 1

## 2020-08-18 MED ORDER — CEFAZOLIN SODIUM-DEXTROSE 2-3 GM-%(50ML) IV SOLR
INTRAVENOUS | Status: DC | PRN
  Administered 2020-08-18: 2 g via INTRAVENOUS

## 2020-08-18 MED ORDER — HYDROMORPHONE HCL 1 MG/ML IJ SOLN: 0.2500 mg | INTRAMUSCULAR | Status: DC | PRN

## 2020-08-18 MED ORDER — PHENYLEPHRINE 40 MCG/ML (10ML) SYRINGE FOR IV PUSH (FOR BLOOD PRESSURE SUPPORT)
PREFILLED_SYRINGE | INTRAVENOUS | Status: DC | PRN
  Administered 2020-08-18: 80 ug via INTRAVENOUS
  Administered 2020-08-18: 120 ug via INTRAVENOUS
  Administered 2020-08-18 (×2): 160 ug via INTRAVENOUS
  Administered 2020-08-18: 80 ug via INTRAVENOUS

## 2020-08-18 MED ORDER — ONDANSETRON HCL 4 MG/2ML IJ SOLN
INTRAMUSCULAR | Status: AC
  Filled 2020-08-18: qty 2

## 2020-08-18 MED ORDER — VANCOMYCIN HCL 1000 MG/200ML IV SOLN
1000.0000 mg | Freq: Two times a day (BID) | INTRAVENOUS | Status: AC
Start: 2020-08-18 — End: 2020-08-18
  Administered 2020-08-18: 1000 mg via INTRAVENOUS
  Filled 2020-08-18: qty 200

## 2020-08-18 MED ORDER — MIDAZOLAM HCL 2 MG/2ML IJ SOLN
INTRAMUSCULAR | Status: DC | PRN
  Administered 2020-08-18: 2 mg via INTRAVENOUS

## 2020-08-18 MED ORDER — METOCLOPRAMIDE HCL 10 MG PO TABS: 5.0000 mg | ORAL_TABLET | Freq: Three times a day (TID) | ORAL | Status: DC | PRN

## 2020-08-18 MED ORDER — DEXAMETHASONE SODIUM PHOSPHATE 10 MG/ML IJ SOLN
INTRAMUSCULAR | Status: DC | PRN
  Administered 2020-08-18: 8 mg via INTRAVENOUS

## 2020-08-18 MED ORDER — DEXAMETHASONE SODIUM PHOSPHATE 10 MG/ML IJ SOLN
INTRAMUSCULAR | Status: DC | PRN
  Administered 2020-08-18: 10 mg

## 2020-08-18 MED ORDER — ONDANSETRON HCL 4 MG/2ML IJ SOLN: 4.0000 mg | Freq: Four times a day (QID) | INTRAMUSCULAR | Status: DC | PRN

## 2020-08-18 MED ORDER — MIDAZOLAM HCL 2 MG/2ML IJ SOLN
INTRAMUSCULAR | Status: AC
  Filled 2020-08-18: qty 2

## 2020-08-18 MED ORDER — ACETAMINOPHEN 325 MG PO TABS
650.0000 mg | ORAL_TABLET | Freq: Four times a day (QID) | ORAL | Status: DC
  Administered 2020-08-18 – 2020-08-19 (×5): 650 mg via ORAL
  Filled 2020-08-18 (×5): qty 2

## 2020-08-18 MED ORDER — OXYCODONE HCL 5 MG PO TABS: 5.0000 mg | ORAL_TABLET | Freq: Once | ORAL | Status: DC | PRN

## 2020-08-18 MED ORDER — FENTANYL CITRATE (PF) 250 MCG/5ML IJ SOLN
INTRAMUSCULAR | Status: AC
  Filled 2020-08-18: qty 5

## 2020-08-18 MED ORDER — ROCURONIUM BROMIDE 10 MG/ML (PF) SYRINGE
PREFILLED_SYRINGE | INTRAVENOUS | Status: DC | PRN
  Administered 2020-08-18: 80 mg via INTRAVENOUS

## 2020-08-18 MED ORDER — OXYCODONE HCL 5 MG PO TABS
5.0000 mg | ORAL_TABLET | ORAL | Status: DC | PRN
  Administered 2020-08-18 – 2020-08-19 (×6): 10 mg via ORAL
  Filled 2020-08-18 (×6): qty 2

## 2020-08-18 MED ORDER — LIDOCAINE HCL (PF) 2 % IJ SOLN
INTRAMUSCULAR | Status: DC | PRN
  Administered 2020-08-18: 60 mg via INTRADERMAL

## 2020-08-18 MED ORDER — DEXAMETHASONE SODIUM PHOSPHATE 10 MG/ML IJ SOLN
INTRAMUSCULAR | Status: AC
  Filled 2020-08-18: qty 1

## 2020-08-18 MED ORDER — ACETAMINOPHEN 500 MG PO TABS: 1000.0000 mg | ORAL_TABLET | Freq: Once | ORAL | Status: DC

## 2020-08-18 MED ORDER — 0.9 % SODIUM CHLORIDE (POUR BTL) OPTIME
TOPICAL | Status: DC | PRN
  Administered 2020-08-18: 1000 mL

## 2020-08-18 MED ORDER — CEFAZOLIN SODIUM 1 G IJ SOLR
INTRAMUSCULAR | Status: AC
  Filled 2020-08-18: qty 20

## 2020-08-18 MED ORDER — FENTANYL CITRATE (PF) 250 MCG/5ML IJ SOLN
INTRAMUSCULAR | Status: DC | PRN
  Administered 2020-08-18 (×2): 50 ug via INTRAVENOUS

## 2020-08-18 MED ORDER — ALBUMIN HUMAN 5 % IV SOLN: INTRAVENOUS | Status: DC | PRN

## 2020-08-18 MED ORDER — ONDANSETRON HCL 4 MG PO TABS
4.0000 mg | ORAL_TABLET | Freq: Four times a day (QID) | ORAL | Status: DC | PRN
  Administered 2020-08-19: 4 mg via ORAL
  Filled 2020-08-18: qty 1

## 2020-08-18 MED ORDER — ROPIVACAINE HCL 5 MG/ML IJ SOLN
INTRAMUSCULAR | Status: DC | PRN
  Administered 2020-08-18: 30 mL via PERINEURAL

## 2020-08-18 SURGICAL SUPPLY — 74 items
ADH SKN CLS APL DERMABOND .7 (GAUZE/BANDAGES/DRESSINGS) ×2
APL PRP STRL LF DISP 70% ISPRP (MISCELLANEOUS) ×2
BANDAGE ESMARK 6X9 LF (GAUZE/BANDAGES/DRESSINGS) IMPLANT
BIT DRILL 2.7MM (BIT) ×1
BLADE CLIPPER SURG (BLADE) ×4 IMPLANT
BLADE SURG 11 STRL SS (BLADE) ×4 IMPLANT
BNDG CMPR 9X6 STRL LF SNTH (GAUZE/BANDAGES/DRESSINGS)
BNDG ELASTIC 4X5.8 VLCR STR LF (GAUZE/BANDAGES/DRESSINGS) ×4 IMPLANT
BNDG ELASTIC 6X5.8 VLCR STR LF (GAUZE/BANDAGES/DRESSINGS) ×4 IMPLANT
BNDG ESMARK 6X9 LF (GAUZE/BANDAGES/DRESSINGS)
CHLORAPREP W/TINT 26 (MISCELLANEOUS) ×4 IMPLANT
COVER SURGICAL LIGHT HANDLE (MISCELLANEOUS) ×4 IMPLANT
COVER WAND RF STERILE (DRAPES) ×4 IMPLANT
CUFF TOURN SGL QUICK 34 (TOURNIQUET CUFF)
CUFF TOURN SGL QUICK 42 (TOURNIQUET CUFF) IMPLANT
CUFF TRNQT CYL 34X4.125X (TOURNIQUET CUFF) ×1 IMPLANT
DECANTER SPIKE VIAL GLASS SM (MISCELLANEOUS) IMPLANT
DERMABOND ADVANCED (GAUZE/BANDAGES/DRESSINGS) ×2
DERMABOND ADVANCED .7 DNX12 (GAUZE/BANDAGES/DRESSINGS) ×2 IMPLANT
DRAPE C-ARM 42X72 X-RAY (DRAPES) ×4 IMPLANT
DRAPE C-ARMOR (DRAPES) ×4 IMPLANT
DRAPE HALF SHEET 40X57 (DRAPES) ×4 IMPLANT
DRAPE IMP U-DRAPE 54X76 (DRAPES) ×4 IMPLANT
DRAPE INCISE IOBAN 66X45 STRL (DRAPES) ×1 IMPLANT
DRAPE ORTHO SPLIT 77X108 STRL (DRAPES) ×8
DRAPE SURG 17X23 STRL (DRAPES) ×6 IMPLANT
DRAPE SURG ORHT 6 SPLT 77X108 (DRAPES) ×4 IMPLANT
DRAPE U-SHAPE 47X51 STRL (DRAPES) ×4 IMPLANT
DRSG MEPILEX BORDER 4X4 (GAUZE/BANDAGES/DRESSINGS) IMPLANT
DRSG MEPILEX BORDER 4X8 (GAUZE/BANDAGES/DRESSINGS) IMPLANT
ELECT REM PT RETURN 9FT ADLT (ELECTROSURGICAL) ×4
ELECTRODE REM PT RTRN 9FT ADLT (ELECTROSURGICAL) ×2 IMPLANT
GLOVE BIO SURGEON STRL SZ 6.5 (GLOVE) ×9 IMPLANT
GLOVE BIO SURGEON STRL SZ7.5 (GLOVE) ×16 IMPLANT
GLOVE BIO SURGEONS STRL SZ 6.5 (GLOVE) ×3
GLOVE BIOGEL PI IND STRL 7.5 (GLOVE) ×2 IMPLANT
GLOVE BIOGEL PI INDICATOR 7.5 (GLOVE) ×2
GLOVE SURG UNDER POLY LF SZ6.5 (GLOVE) ×4 IMPLANT
GOWN STRL REUS W/ TWL LRG LVL3 (GOWN DISPOSABLE) ×4 IMPLANT
GOWN STRL REUS W/TWL LRG LVL3 (GOWN DISPOSABLE) ×8
GUIDE DRILL 65 F/3.2 THRD TIP (BIT) ×2 IMPLANT
IMMOBILIZER KNEE 22 UNIV (SOFTGOODS) ×4 IMPLANT
KIT BASIN OR (CUSTOM PROCEDURE TRAY) ×4 IMPLANT
KIT TURNOVER KIT B (KITS) ×4 IMPLANT
MANIFOLD NEPTUNE II (INSTRUMENTS) ×1 IMPLANT
NEEDLE 22X1 1/2 (OR ONLY) (NEEDLE) IMPLANT
NS IRRIG 1000ML POUR BTL (IV SOLUTION) ×4 IMPLANT
PACK GENERAL/GYN (CUSTOM PROCEDURE TRAY) ×4 IMPLANT
PACK TOTAL JOINT (CUSTOM PROCEDURE TRAY) ×4 IMPLANT
PACK UNIVERSAL I (CUSTOM PROCEDURE TRAY) ×4 IMPLANT
PAD ARMBOARD 7.5X6 YLW CONV (MISCELLANEOUS) ×8 IMPLANT
RETRIEVER SUT HEWSON (MISCELLANEOUS) IMPLANT
SPONGE LAP 18X18 RF (DISPOSABLE) IMPLANT
STAPLER VISISTAT 35W (STAPLE) ×1 IMPLANT
SUCTION FRAZIER HANDLE 10FR (MISCELLANEOUS)
SUCTION TUBE FRAZIER 10FR DISP (MISCELLANEOUS) ×1 IMPLANT
SUT ETHILON 3 0 PS 1 (SUTURE) ×3 IMPLANT
SUT FIBERWIRE #2 38 T-5 BLUE (SUTURE) ×8
SUT FIBERWIRE #5 38 CONV NDL (SUTURE) ×4
SUT MNCRL AB 3-0 PS2 18 (SUTURE) ×1 IMPLANT
SUT MON AB 2-0 CT1 36 (SUTURE) ×1 IMPLANT
SUT VIC AB 0 CT1 27 (SUTURE) ×12
SUT VIC AB 0 CT1 27XBRD ANBCTR (SUTURE) ×4 IMPLANT
SUT VIC AB 1 CT1 27 (SUTURE)
SUT VIC AB 1 CT1 27XBRD ANBCTR (SUTURE) ×1 IMPLANT
SUT VIC AB 2-0 CT1 27 (SUTURE) ×4
SUT VIC AB 2-0 CT1 TAPERPNT 27 (SUTURE) ×2 IMPLANT
SUTURE FIBERWR #2 38 T-5 BLUE (SUTURE) ×2 IMPLANT
SUTURE FIBERWR #5 38 CONV NDL (SUTURE) ×1 IMPLANT
SYR CONTROL 10ML LL (SYRINGE) IMPLANT
TOWEL GREEN STERILE (TOWEL DISPOSABLE) ×4 IMPLANT
TRAY FOLEY MTR SLVR 16FR STAT (SET/KITS/TRAYS/PACK) IMPLANT
UNDERPAD 30X36 HEAVY ABSORB (UNDERPADS AND DIAPERS) ×4 IMPLANT
WATER STERILE IRR 1000ML POUR (IV SOLUTION) ×1 IMPLANT

## 2020-08-18 NOTE — Anesthesia Postprocedure Evaluation (Signed)
Anesthesia Post Note  Patient: MAKAEL STEIN  Procedure(s) Performed: OPEN REDUCTION INTERNAL (ORIF) FIXATION PATELLA (Left: Knee)     Patient location during evaluation: PACU Anesthesia Type: Regional and General Level of consciousness: awake and alert, oriented and patient cooperative Pain management: pain level controlled Vital Signs Assessment: post-procedure vital signs reviewed and stable Respiratory status: spontaneous breathing, nonlabored ventilation and respiratory function stable Cardiovascular status: blood pressure returned to baseline and stable Postop Assessment: no apparent nausea or vomiting Anesthetic complications: no   No notable events documented.  Last Vitals:  Vitals:   08/18/20 0930 08/18/20 0939  BP: (!) 150/86 139/85  Pulse: (!) 103 93  Resp: 17 16  Temp:  (!) 36.3 C  SpO2: 95% 95%    Last Pain:  Vitals:   08/18/20 0939  TempSrc:   PainSc: 0-No pain                 Lannie Fields

## 2020-08-18 NOTE — Transfer of Care (Signed)
Immediate Anesthesia Transfer of Care Note  [name]  Procedure(s) Performed: OPEN REDUCTION INTERNAL (ORIF) FIXATION PATELLA (Left: Knee)  Patient Location: PACU  Anesthesia Type:General  Level of Consciousness: awake, alert  and oriented  Airway & Oxygen Therapy: Patient Spontanous Breathing and Patient connected to face mask oxygen  Post-op Assessment: Report given to RN and Post -op Vital signs reviewed and stable  Post vital signs: Reviewed and stable  Last Vitals:  Vitals Value Taken Time  BP 154/85 08/18/20 0916  Temp    Pulse 115 08/18/20 0916  Resp 27 08/18/20 0921  SpO2 95 % 08/18/20 0916  Vitals shown include unvalidated device data.  Last Pain:  Vitals:   08/18/20 0629  TempSrc:   PainSc: 6       Patients Stated Pain Goal: 0 (08/18/20 0600)  Complications: No notable events documented.

## 2020-08-18 NOTE — Interval H&P Note (Signed)
History and Physical Interval Note:  08/18/2020 7:25 AM  Jeffrey Ramsey  has presented today for surgery, with the diagnosis of Patella fracture and pelvic fracture.  The various methods of treatment have been discussed with the patient and family. After consideration of risks, benefits and other options for treatment, the patient has consented to  Procedure(s): OPEN REDUCTION INTERNAL (ORIF) FIXATION PATELLA (Left) POSSIBLE FIXATION OF PELVIC FRACTURE WITH PERCUTANEOUS SCREWS (Left) as a surgical intervention.  The patient's history has been reviewed, patient examined, no change in status, stable for surgery.  I have reviewed the patient's chart and labs.  Questions were answered to the patient's satisfaction.     Caryn Bee P Adil Tugwell
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
## 2020-08-18 NOTE — Anesthesia Procedure Notes (Signed)
Procedure Name: Intubation Date/Time: 08/18/2020 7:40 AM Performed by: Inda Coke, CRNA Pre-anesthesia Checklist: Patient identified, Emergency Drugs available, Suction available and Patient being monitored Patient Re-evaluated:Patient Re-evaluated prior to induction Oxygen Delivery Method: Circle System Utilized Preoxygenation: Pre-oxygenation with 100% oxygen Induction Type: IV induction Ventilation: Mask ventilation without difficulty and Oral airway inserted - appropriate to patient size Laryngoscope Size: Mac and 4 Grade View: Grade I Tube type: Oral Tube size: 7.5 mm Number of attempts: 1 Airway Equipment and Method: Stylet and Oral airway Placement Confirmation: ETT inserted through vocal cords under direct vision, positive ETCO2 and breath sounds checked- equal and bilateral Secured at: 22 cm Tube secured with: Tape Dental Injury: Teeth and Oropharynx as per pre-operative assessment

## 2020-08-18 NOTE — Discharge Instructions (Addendum)
[facility] Discharge Instructions   General Discharge Instructions  WEIGHT BEARING STATUS: Weightbearing as tolerated bilateral lower extremities  RANGE OF MOTION/ACTIVITY: Ok for range of motion of right hip and knee. Keep hinge brace locked in full extension on left knee  Wound Care: Dressing may be removed. Incisions to left knee can be left open to air if there is no drainage. If incision continues to have drainage, follow wound care instructions below. Okay to shower if no drainage from incisions.  DVT/PE prophylaxis: Aspirin  Diet: as you were eating previously.  Can use over the counter stool softeners and bowel preparations, such as Miralax, to help with bowel movements.  Narcotics can be constipating.  Be sure to drink plenty of fluids  PAIN MEDICATION USE AND EXPECTATIONS  You have likely been given narcotic medications to help control your pain.  After a traumatic event that results in an fracture (broken bone) with or without surgery, it is ok to use narcotic pain medications to help control one's pain.  We understand that everyone responds to pain differently and each individual patient will be evaluated on a regular basis for the continued need for narcotic medications. Ideally, narcotic medication use should last no more than 6-8 weeks (coinciding with fracture healing).   As a patient it is your responsibility as well to monitor narcotic medication use and report the amount and frequency you use these medications when you come to your office visit.   We would also advise that if you are using narcotic medications, you should take a dose prior to therapy to maximize you participation.  IF YOU ARE ON NARCOTIC MEDICATIONS IT IS NOT PERMISSIBLE TO OPERATE A MOTOR VEHICLE (MOTORCYCLE/CAR/TRUCK/MOPED) OR HEAVY MACHINERY DO NOT MIX NARCOTICS WITH OTHER CNS (CENTRAL NERVOUS SYSTEM) DEPRESSANTS SUCH AS ALCOHOL   STOP SMOKING OR USING NICOTINE PRODUCTS!!!!  As  discussed nicotine severely impairs your body's ability to heal surgical and traumatic wounds but also impairs bone healing.  Wounds and bone heal by forming microscopic blood vessels (angiogenesis) and nicotine is a vasoconstrictor (essentially, shrinks blood vessels).  Therefore, if vasoconstriction occurs to these microscopic blood vessels they essentially disappear and are unable to deliver necessary nutrients to the healing tissue.  This is one modifiable factor that you can do to dramatically increase your chances of healing your injury.    (This means no smoking, no nicotine gum, patches, etc)  DO NOT USE NONSTEROIDAL ANTI-INFLAMMATORY DRUGS (NSAID'S)  Using products such as Advil (ibuprofen), Aleve (naproxen), Motrin (ibuprofen) for additional pain control during fracture healing can delay and/or prevent the healing response.  If you would like to take over the counter (OTC) medication, Tylenol (acetaminophen) is ok.  However, some narcotic medications that are given for pain control contain acetaminophen as well. Therefore, you should not exceed more than 4000 mg of tylenol in a day if you do not have liver disease.  Also note that there are may OTC medicines, such as cold medicines and allergy medicines that my contain tylenol as well.  If you have any questions about medications and/or interactions please ask your doctor/PA or your pharmacist.      ICE AND ELEVATE INJURED/OPERATIVE EXTREMITY  Using ice and elevating the injured extremity above your heart can help with swelling and pain control.  Icing in a pulsatile fashion, such as 20 minutes on and 20 minutes off, can be followed.    Do not place ice directly on skin. Make sure there is a  barrier between to skin and the ice pack.    Using frozen items such as frozen peas works well as the conform nicely to the are that needs to be iced.  USE AN ACE WRAP OR TED HOSE FOR SWELLING CONTROL  In addition to icing and elevation, Ace wraps or TED  hose are used to help limit and resolve swelling.  It is recommended to use Ace wraps or TED hose until you are informed to stop.    When using Ace Wraps start the wrapping distally (farthest away from the body) and wrap proximally (closer to the body)   Example: If you had surgery on your leg or thing and you do not have a splint on, start the ace wrap at the toes and work your way up to the thigh        If you had surgery on your upper extremity and do not have a splint on, start the ace wrap at your fingers and work your way up to the upper arm   CALL THE OFFICE WITH ANY QUESTIONS/CONCERNS OR MEDICATION REFILL: [phone]   VISIT OUR WEBSITE FOR ADDITIONAL INFORMATION: [email or web address]    Discharge Wound Care Instructions  Do NOT apply any ointments, solutions or lotions to pin sites or surgical wounds.  These prevent needed drainage and even though solutions like hydrogen peroxide kill bacteria, they also damage cells lining the pin sites that help fight infection.  Applying lotions or ointments can keep the wounds moist and can cause them to breakdown and open up as well. This can increase the risk for infection. When in doubt call the office.  If any drainage is noted, use one layer of adaptic, then gauze, Kerlix, and an ace wrap.  Once the incision is completely dry and without drainage, it may be left open to air out.  Showering may begin 36-48 hours later.  Cleaning gently with soap and water.   RIB FRACTURES  HOME INSTRUCTIONS   PAIN CONTROL:  Pain is best controlled by a usual combination of three different methods TOGETHER:  Ice/Heat Over the counter pain medication Prescription pain medication You may experience some swelling and bruising in area of broken ribs. Ice packs or heating pads (30-60 minutes up to 6 times a day) will help. Use ice for the first few days to help decrease swelling and bruising, then switch to heat to help relax tight/sore spots and speed  recovery. Some people prefer to use ice alone, heat alone, alternating between ice & heat. Experiment to what works for you. Swelling and bruising can take several weeks to resolve.  It is helpful to take an over-the-counter pain medication regularly for the first few weeks. Choose one of the following that works best for you:  Naproxen (Aleve, etc) Two 220mg  tabs twice a day Ibuprofen (Advil, etc) Three 200mg  tabs four times a day (every meal & bedtime) Acetaminophen (Tylenol, etc) 500-650mg  four times a day (every meal & bedtime) A prescription for pain medication (such as oxycodone, hydrocodone, etc) may be given to you upon discharge. Take your pain medication as prescribed.  If you are having problems/concerns with the prescription medicine (does not control pain, nausea, vomiting, rash, itching, etc), please call [phone] to see if we need to switch you to a different pain medicine that will work better for you and/or control your side effect better. If you need a refill on your pain medication, please contact your pharmacy. They will contact our office to  request authorization. Prescriptions will not be filled after 5 pm or on week-ends. Avoid getting constipated. When taking pain medications, it is common to experience some constipation. Increasing fluid intake and taking a fiber supplement (such as Metamucil, Citrucel, FiberCon, MiraLax, etc) 1-2 times a day regularly will usually help prevent this problem from occurring. A mild laxative (prune juice, Milk of Magnesia, MiraLax, etc) should be taken according to package directions if there are no bowel movements after 48 hours.  Watch out for diarrhea. If you have many loose bowel movements, simplify your diet to bland foods & liquids for a few days. Stop any stool softeners and decrease your fiber supplement. Switching to mild anti-diarrheal medications (Kayopectate, Pepto Bismol) can help. If this worsens or does not improve, please call  us. FOLLOW UP  If a follow up appointment is needed one will be scheduled for you. If none is needed with our trauma team, please follow up with your primary care provider within 2-3 weeks from discharge. Please call [facility] at [phone] if you have any questions about follow up.  If you have any orthopedic or other injuries you will need to follow up as outlined in your follow up instructions.   WHEN TO CALL us [phone]:  Poor pain control Reactions / problems with new medications (rash/itching, nausea, etc)  Fever over 101.5 F (38.5 C) Worsening swelling or bruising Worsening pain, productive cough, difficulty breathing or any other concerning symptoms  The clinic staff is available to answer your questions during regular business hours (8:30am-5pm). Please don't hesitate to call and ask to speak to one of our nurses for clinical concerns.  If you have a medical emergency, go to the nearest emergency room or call 911.  A surgeon from [facility] is always on call at the [facility], [place]  [address], [place], [place] [address] ?  MAIN: [phone] ? TOLL FREE: [phone] ?  FAX [phone]  [email or web address]      Information on Rib Fractures  A rib fracture is a break or crack in one of the bones of the ribs. The ribs are long, curved bones that wrap around your chest and attach to your spine and your breastbone. The ribs protect your heart, lungs, and other organs in the chest. A broken or cracked rib is often painful but is not usually serious. Most rib fractures heal on their own over time. However, rib fractures can be more serious if multiple ribs are broken or if broken ribs move out of place and push against other structures or organs. What are the causes? This condition is caused by: Repetitive movements with high force, such as pitching a baseball or having severe coughing spells. A direct  blow to the chest, such as a sports injury, a car accident, or a fall. Cancer that has spread to the bones, which can weaken bones and cause them to break. What are the signs or symptoms? Symptoms of this condition include: Pain when you breathe in or cough. Pain when someone presses on the injured area. Feeling short of breath. How is this diagnosed? This condition is diagnosed with a physical exam and medical history. Imaging tests may also be done, such as: Chest X-ray. CT scan. MRI. Bone scan. Chest ultrasound. How is this treated? Treatment for this condition depends on the severity of the fracture. Most rib fractures usually heal on their own in 1-3 months. Sometimes healing takes  longer if there is a cough that does not stop or if there are other activities that make the injury worse (aggravating factors). While you heal, you will be given medicines to control the pain. You will also be taught deep breathing exercises. Severe injuries may require hospitalization or surgery. Follow these instructions at home: Managing pain, stiffness, and swelling If directed, apply ice to the injured area. Put ice in a plastic bag. Place a towel between your skin and the bag. Leave the ice on for 20 minutes, 2-3 times a day. Take over-the-counter and prescription medicines only as told by your health care provider. Activity Avoid a lot of activity and any activities or movements that cause pain. Be careful during activities and avoid bumping the injured rib. Slowly increase your activity as told by your health care provider. General instructions Do deep breathing exercises as told by your health care provider. This helps prevent pneumonia, which is a common complication of a broken rib. Your health care provider may instruct you to: Take deep breaths several times a day. Try to cough several times a day, holding a pillow against the injured area. Use a device called incentive spirometer to  practice deep breathing several times a day. Drink enough fluid to keep your urine pale yellow. Do not wear a rib belt or binder. These restrict breathing, which can lead to pneumonia. Keep all follow-up visits as told by your health care provider. This is important. Contact a health care provider if: You have a fever. Get help right away if: You have difficulty breathing or you are short of breath. You develop a cough that does not stop, or you cough up thick or bloody sputum. You have nausea, vomiting, or pain in your abdomen. Your pain gets worse and medicine does not help. Summary A rib fracture is a break or crack in one of the bones of the ribs. A broken or cracked rib is often painful but is not usually serious. Most rib fractures heal on their own over time. Treatment for this condition depends on the severity of the fracture. Avoid a lot of activity and any activities or movements that cause pain. This information is not intended to replace advice given to you by your health care provider. Make sure you discuss any questions you have with your health care provider. Document Released: 02/22/2005 Document Revised: 05/24/2016 Document Reviewed: 05/24/2016 Elsevier Interactive Patient Education  2019 [employer].

## 2020-08-18 NOTE — Progress Notes (Signed)
Orthopedic Tech Progress Note Patient Details:  Jeffrey Ramsey 09/13/1960 [id number]  Called in order to [employer] for a HINGED KNEE BRACE  Patient ID: Jeffrey Ramsey, male   DOB: [birth year], 60 y.o.   MRN: [id number]  [name] 08/18/2020, 10:10 AM

## 2020-08-18 NOTE — Progress Notes (Signed)
Transport here to take patient to OR.

## 2020-08-18 NOTE — Anesthesia Procedure Notes (Signed)
Anesthesia Regional Block: Femoral nerve block   Pre-Anesthetic Checklist: , timeout performed,  Correct Patient, Correct Site, Correct Laterality,  Correct Procedure, Correct Position, site marked,  Risks and benefits discussed,  Surgical consent,  Pre-op evaluation,  At surgeon's request and post-op pain management  Laterality: Left  Prep: Maximum Sterile Barrier Precautions used, chloraprep       Needles:  Injection technique: Single-shot  Needle Type: Echogenic Stimulator Needle     Needle Length: 9cm  Needle Gauge: 22     Additional Needles:   Procedures:,,,, ultrasound used (permanent image in chart),,    Narrative:  Start time: 08/18/2020 7:20 AM End time: 08/18/2020 7:27 AM Injection made incrementally with aspirations every 5 mL.  Performed by: Personally  Anesthesiologist: Lannie Fields, DO  Additional Notes: Monitors applied. No increased pain on injection. No increased resistance to injection. Injection made in 5cc increments. Good needle visualization. Patient tolerated procedure well.
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
## 2020-08-18 NOTE — Progress Notes (Signed)
PT Cancellation Note  Patient Details Name: Jeffrey Ramsey MRN: [id number] DOB: 12-31-1960   Cancelled Treatment:    Reason Eval/Treat Not Completed: Patient at procedure or test/unavailable (OR).  Lillia Pauls, PT, DPT Acute Rehabilitation Services Pager [phone] Office [phone]    Norval Morton 08/18/2020, 7:52 AM

## 2020-08-18 NOTE — Progress Notes (Signed)
Report given to pre-op nurse. Awaiting transport for sx.

## 2020-08-18 NOTE — Progress Notes (Signed)
Orthopedic Tech Progress Note Patient Details:  Jeffrey Ramsey Jul 09, 1960 [id number] Applied overhead frame to pts bed Patient ID: HOBSON LAX, male   DOB: 10-Jul-1960, 60 y.o.   MRN: [id number]  Gerald Stabs 08/18/2020, 2:04 PM
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
## 2020-08-19 ENCOUNTER — Encounter (HOSPITAL_COMMUNITY): Payer: Self-pay | Admitting: Student

## 2020-08-19 ENCOUNTER — Other Ambulatory Visit (HOSPITAL_COMMUNITY): Payer: Self-pay

## 2020-08-19 LAB — CBC
HCT: 29.6 % — ABNORMAL LOW (ref 39.0–52.0)
Hemoglobin: 10.4 g/dL — ABNORMAL LOW (ref 13.0–17.0)
MCH: 31.5 pg (ref 26.0–34.0)
MCHC: 35.1 g/dL (ref 30.0–36.0)
MCV: 89.7 fL (ref 80.0–100.0)
Platelets: 172 10*3/uL (ref 150–400)
RBC: 3.3 MIL/uL — ABNORMAL LOW (ref 4.22–5.81)
RDW: 12 % (ref 11.5–15.5)
WBC: 11 10*3/uL — ABNORMAL HIGH (ref 4.0–10.5)
nRBC: 0 % (ref 0.0–0.2)

## 2020-08-19 LAB — BASIC METABOLIC PANEL
Anion gap: 5 (ref 5–15)
BUN: 22 mg/dL — ABNORMAL HIGH (ref 6–20)
CO2: 26 mmol/L (ref 22–32)
Calcium: 8.2 mg/dL — ABNORMAL LOW (ref 8.9–10.3)
Chloride: 106 mmol/L (ref 98–111)
Creatinine, Ser: 0.96 mg/dL (ref 0.61–1.24)
GFR, Estimated: >60 mL/min (ref >60)
Glucose, Bld: 106 mg/dL — ABNORMAL HIGH (ref 70–99)
Potassium: 4.5 mmol/L (ref 3.5–5.1)
Sodium: 137 mmol/L (ref 135–145)

## 2020-08-19 MED ORDER — POLYETHYLENE GLYCOL 3350 17 G PO PACK: 17.0000 g | PACK | Freq: Every day | ORAL | 0 refills | Status: DC | PRN

## 2020-08-19 MED ORDER — OXYCODONE HCL 10 MG PO TABS
5.0000 mg | ORAL_TABLET | Freq: Four times a day (QID) | ORAL | 0 refills | Status: DC | PRN
  Filled 2020-08-19: qty 25, 7d supply, fill #0

## 2020-08-19 MED ORDER — ASPIRIN 81 MG PO TBEC
81.0000 mg | DELAYED_RELEASE_TABLET | Freq: Every day | ORAL | 0 refills | Status: AC
  Filled 2020-08-19: qty 30, 30d supply, fill #0

## 2020-08-19 MED ORDER — ACETAMINOPHEN 325 MG PO TABS: 650.0000 mg | ORAL_TABLET | Freq: Four times a day (QID) | ORAL | Status: DC | PRN

## 2020-08-19 MED ORDER — METHOCARBAMOL 750 MG PO TABS
750.0000 mg | ORAL_TABLET | Freq: Three times a day (TID) | ORAL | 0 refills | Status: DC | PRN
  Filled 2020-08-19: qty 30, 10d supply, fill #0
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
## 2020-08-19 NOTE — Progress Notes (Signed)
Patient ID: Jeffrey Ramsey, male   DOB: 05/25/1960, 60 y.o.   MRN: [id number]   LOS: 4 days   Subjective: Had some significant knee pain overnight but feeling better this morning with ice and elevation. Hinged knee brace in place but unlocked.   Objective: Vital signs in last 24 hours: Temp:  [97.4 F (36.3 C)-98.5 F (36.9 C)] 98.3 F (36.8 C) (06/14 0740) Pulse Rate:  [73-115] 75 (06/14 0740) Resp:  [12-20] 20 (06/14 0740) BP: (120-161)/(72-99) 143/78 (06/14 0740) SpO2:  [95 %-99 %] 99 % (06/14 0740) Last BM Date:  (PTA)   Laboratory  CBC Recent Labs    08/17/20 2349 08/19/20 0218  WBC 7.5 11.0*  HGB 11.5* 10.4*  HCT 34.0* 29.6*  PLT 170 172   BMET Recent Labs    08/17/20 2349 08/19/20 0218  NA 138 137  K 4.4 4.5  CL 105 106  CO2 28 26  GLUCOSE 90 106*  BUN 13 22*  CREATININE 0.94 0.96  CALCIUM 8.1* 8.2*     Physical Exam General appearance: alert and no distress LLE: Hinged knee brace in place, sensation/motor grossly intact, compartments soft   Assessment/Plan: S/p ORIF left patella -- Plan WBAT in hinged knee brace locked in extension.    Freeman Caldron, PA-C Orthopedic Surgery [phone] 08/19/2020
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
## 2020-08-19 NOTE — Progress Notes (Signed)
PT Cancellation Note  Patient Details Name: Jeffrey Ramsey MRN: [id number] DOB: Nov 25, 1960   Cancelled Treatment:    Reason Eval/Treat Not Completed: Other (comment) Pt actively vomiting; RN aware.  Lillia Pauls, PT, DPT Acute Rehabilitation Services Pager [phone] Office [phone]    Norval Morton 08/19/2020, 8:56 AM

## 2020-08-28 ENCOUNTER — Ambulatory Visit: Payer: No Typology Code available for payment source | Attending: Physician Assistant

## 2020-09-02 ENCOUNTER — Ambulatory Visit: Payer: No Typology Code available for payment source

## 2020-09-26 ENCOUNTER — Ambulatory Visit: Payer: No Typology Code available for payment source
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
## 2023-01-27 IMAGING — RF DG C-ARM 1-60 MIN
1 series · 4 of 4 positions shown · non-contrast
Comparison: Radiography from 4 days ago

CLINICAL DATA: ORIF of left patella

EXAM:
LEFT KNEE - 1-2 VIEW; DG C-ARM 1-60 MIN

[Series 1: run · 4 of 4 slices shown]
[im 1/4]
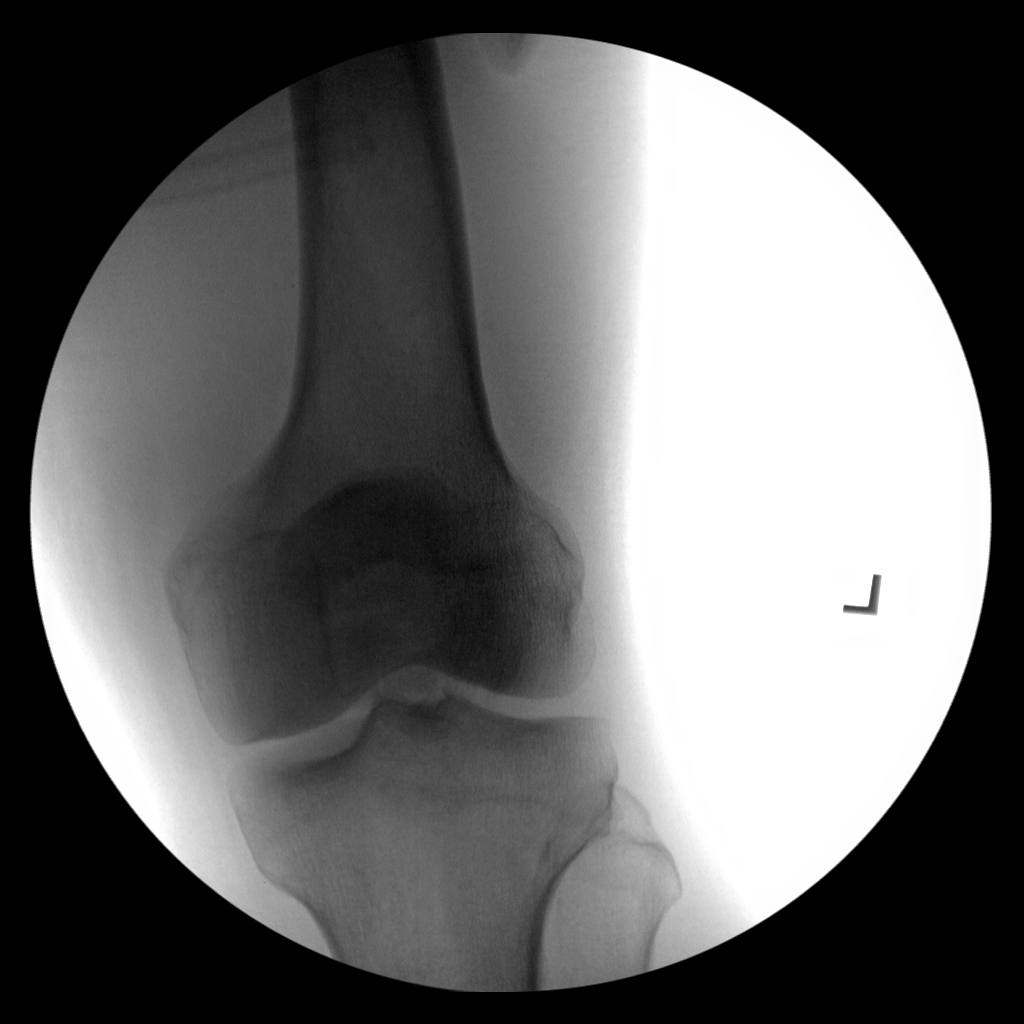
[im 2/4]
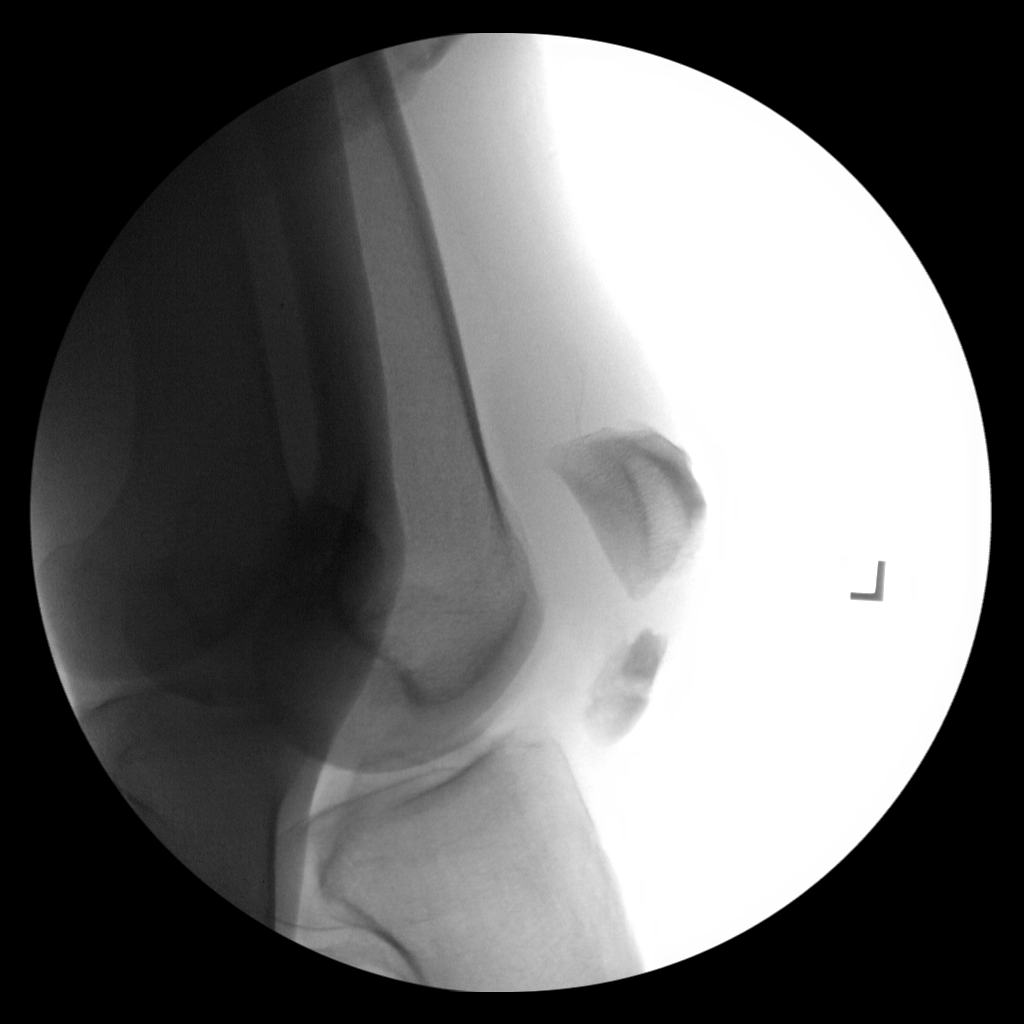
[im 3/4]
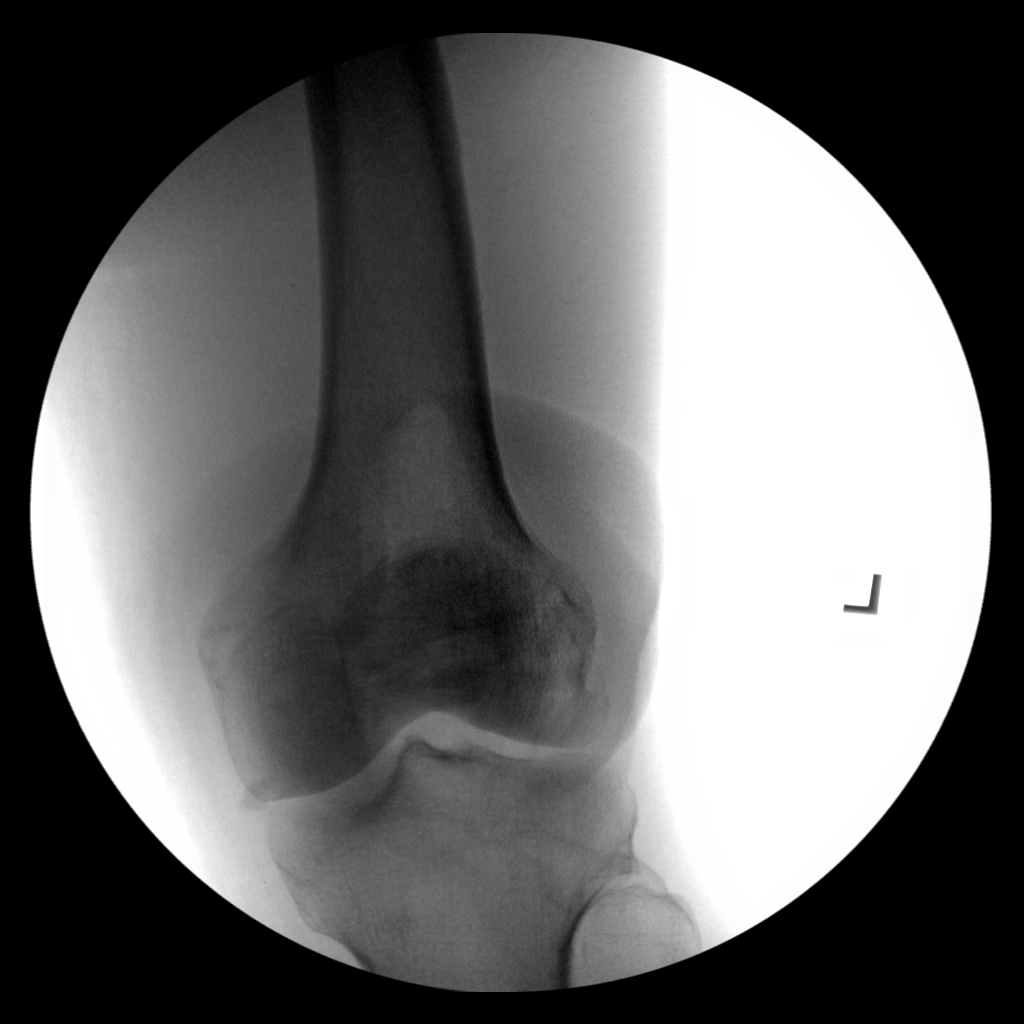
[im 4/4]
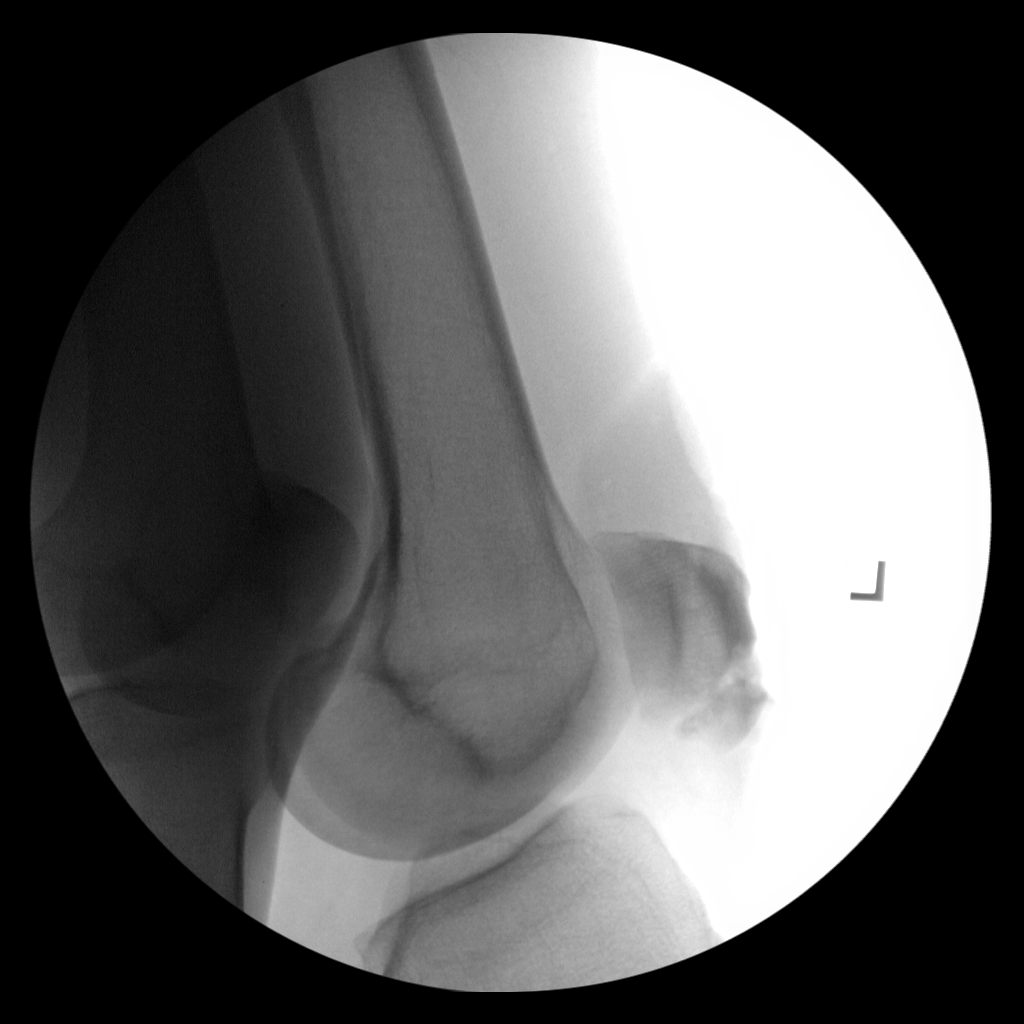

[4 of 4 positions shown; findings below may reference images not displayed]

FINDINGS: Intra procedure fluoroscopy for reduction of an inferior patellar
fracture. No unexpected finding.
IMPRESSION: Fluoroscopy for patellar fracture repair.

## 2023-05-16 ENCOUNTER — Other Ambulatory Visit: Payer: Self-pay

## 2023-05-16 ENCOUNTER — Inpatient Hospital Stay
Admission: EM | Admit: 2023-05-16 | Discharge: 2023-05-19 | DRG: 917 | Disposition: A | Discharge status: Home / Self Care | Payer: Self-pay | Attending: Internal Medicine | Admitting: Internal Medicine

## 2023-05-16 ENCOUNTER — Emergency Department: Payer: Self-pay

## 2023-05-16 ENCOUNTER — Encounter: Payer: Self-pay | Admitting: Intensive Care

## 2023-05-16 DIAGNOSIS — I509 Heart failure, unspecified: Secondary | ICD-10-CM

## 2023-05-16 DIAGNOSIS — Z716 Tobacco abuse counseling: Secondary | ICD-10-CM

## 2023-05-16 DIAGNOSIS — Z880 Allergy status to penicillin: Secondary | ICD-10-CM

## 2023-05-16 DIAGNOSIS — T502X5A Adverse effect of carbonic-anhydrase inhibitors, benzothiadiazides and other diuretics, initial encounter: Secondary | ICD-10-CM | POA: Diagnosis not present

## 2023-05-16 DIAGNOSIS — T405X1A Poisoning by cocaine, accidental (unintentional), initial encounter: Secondary | ICD-10-CM | POA: Diagnosis present

## 2023-05-16 DIAGNOSIS — F1410 Cocaine abuse, uncomplicated: Secondary | ICD-10-CM | POA: Diagnosis present

## 2023-05-16 DIAGNOSIS — Z79899 Other long term (current) drug therapy: Secondary | ICD-10-CM

## 2023-05-16 DIAGNOSIS — Z8249 Family history of ischemic heart disease and other diseases of the circulatory system: Secondary | ICD-10-CM | POA: Diagnosis not present

## 2023-05-16 DIAGNOSIS — Z1152 Encounter for screening for COVID-19: Secondary | ICD-10-CM | POA: Diagnosis not present

## 2023-05-16 DIAGNOSIS — I5A Non-ischemic myocardial injury (non-traumatic): Secondary | ICD-10-CM | POA: Diagnosis present

## 2023-05-16 DIAGNOSIS — I10 Essential (primary) hypertension: Secondary | ICD-10-CM | POA: Diagnosis present

## 2023-05-16 DIAGNOSIS — R0602 Shortness of breath: Principal | ICD-10-CM

## 2023-05-16 DIAGNOSIS — N179 Acute kidney failure, unspecified: Secondary | ICD-10-CM | POA: Diagnosis present

## 2023-05-16 DIAGNOSIS — I110 Hypertensive heart disease with heart failure: Secondary | ICD-10-CM | POA: Diagnosis present

## 2023-05-16 DIAGNOSIS — I21A1 Myocardial infarction type 2: Secondary | ICD-10-CM | POA: Diagnosis present

## 2023-05-16 DIAGNOSIS — I5021 Acute systolic (congestive) heart failure: Secondary | ICD-10-CM | POA: Diagnosis present

## 2023-05-16 DIAGNOSIS — R002 Palpitations: Secondary | ICD-10-CM | POA: Diagnosis present

## 2023-05-16 DIAGNOSIS — R7989 Other specified abnormal findings of blood chemistry: Secondary | ICD-10-CM

## 2023-05-16 DIAGNOSIS — F17210 Nicotine dependence, cigarettes, uncomplicated: Secondary | ICD-10-CM | POA: Diagnosis present

## 2023-05-16 DIAGNOSIS — Z720 Tobacco use: Secondary | ICD-10-CM | POA: Diagnosis present

## 2023-05-16 DIAGNOSIS — F1490 Cocaine use, unspecified, uncomplicated: Secondary | ICD-10-CM

## 2023-05-16 HISTORY — DX: Cocaine abuse, uncomplicated: F14.10

## 2023-05-16 HISTORY — DX: Tobacco use: Z72.0

## 2023-05-16 LAB — CBC WITH DIFFERENTIAL/PLATELET
Abs Immature Granulocytes: 0.02 10*3/uL (ref 0.00–0.07)
Basophils Absolute: 0 10*3/uL (ref 0.0–0.1)
Basophils Relative: 0 %
Eosinophils Absolute: 0.2 10*3/uL (ref 0.0–0.5)
Eosinophils Relative: 2 %
HCT: 43.2 % (ref 39.0–52.0)
Hemoglobin: 14.2 g/dL (ref 13.0–17.0)
Immature Granulocytes: 0 %
Lymphocytes Relative: 19 %
Lymphs Abs: 1.6 10*3/uL (ref 0.7–4.0)
MCH: 30.5 pg (ref 26.0–34.0)
MCHC: 32.9 g/dL (ref 30.0–36.0)
MCV: 92.7 fL (ref 80.0–100.0)
Monocytes Absolute: 0.6 10*3/uL (ref 0.1–1.0)
Monocytes Relative: 7 %
Neutro Abs: 6.2 10*3/uL (ref 1.7–7.7)
Neutrophils Relative %: 72 %
Platelets: 246 10*3/uL (ref 150–400)
RBC: 4.66 MIL/uL (ref 4.22–5.81)
RDW: 13.6 % (ref 11.5–15.5)
WBC: 8.5 10*3/uL (ref 4.0–10.5)
nRBC: 0 % (ref 0.0–0.2)

## 2023-05-16 LAB — COMPREHENSIVE METABOLIC PANEL
ALT: 69 U/L — ABNORMAL HIGH (ref 0–44)
AST: 29 U/L (ref 15–41)
Albumin: 3.6 g/dL (ref 3.5–5.0)
Alkaline Phosphatase: 101 U/L (ref 38–126)
Anion gap: 5 (ref 5–15)
BUN: 27 mg/dL — ABNORMAL HIGH (ref 8–23)
CO2: 24 mmol/L (ref 22–32)
Calcium: 8.6 mg/dL — ABNORMAL LOW (ref 8.9–10.3)
Chloride: 110 mmol/L (ref 98–111)
Creatinine, Ser: 1.34 mg/dL — ABNORMAL HIGH (ref 0.61–1.24)
GFR, Estimated: 60 mL/min — ABNORMAL LOW (ref >60)
Glucose, Bld: 92 mg/dL (ref 70–99)
Potassium: 4.5 mmol/L (ref 3.5–5.1)
Sodium: 139 mmol/L (ref 135–145)
Total Bilirubin: 0.7 mg/dL (ref 0.0–1.2)
Total Protein: 6.6 g/dL (ref 6.5–8.1)

## 2023-05-16 LAB — HEMOGLOBIN A1C
Hgb A1c MFr Bld: 5.8 % — ABNORMAL HIGH (ref 4.8–5.6)
Mean Plasma Glucose: 119.76 mg/dL

## 2023-05-16 LAB — PROTIME-INR
INR: 1.1 (ref 0.8–1.2)
Prothrombin Time: 14.2 s (ref 11.4–15.2)

## 2023-05-16 LAB — TROPONIN I (HIGH SENSITIVITY)
Troponin I (High Sensitivity): 31 ng/L — ABNORMAL HIGH (ref <18)
Troponin I (High Sensitivity): 28 ng/L — ABNORMAL HIGH (ref <18)

## 2023-05-16 LAB — URINE DRUG SCREEN, QUALITATIVE (ARMC ONLY)
Amphetamines, Ur Screen: NOT DETECTED
Barbiturates, Ur Screen: NOT DETECTED
Benzodiazepine, Ur Scrn: NOT DETECTED
Cannabinoid 50 Ng, Ur ~~LOC~~: NOT DETECTED
Cocaine Metabolite,Ur ~~LOC~~: POSITIVE — AB
MDMA (Ecstasy)Ur Screen: NOT DETECTED
Methadone Scn, Ur: NOT DETECTED
Opiate, Ur Screen: NOT DETECTED
Phencyclidine (PCP) Ur S: NOT DETECTED
Tricyclic, Ur Screen: NOT DETECTED

## 2023-05-16 LAB — MAGNESIUM: Magnesium: 2.1 mg/dL (ref 1.7–2.4)

## 2023-05-16 LAB — LIPASE, BLOOD: Lipase: 28 U/L (ref 11–51)

## 2023-05-16 LAB — BRAIN NATRIURETIC PEPTIDE: B Natriuretic Peptide: 1196.1 pg/mL — ABNORMAL HIGH (ref 0.0–100.0)

## 2023-05-16 LAB — SARS CORONAVIRUS 2 BY RT PCR: SARS Coronavirus 2 by RT PCR: NEGATIVE

## 2023-05-16 LAB — APTT: aPTT: 29 s (ref 24–36)

## 2023-05-16 MED ORDER — ASPIRIN 81 MG PO CHEW
324.0000 mg | CHEWABLE_TABLET | Freq: Once | ORAL | Status: AC
  Administered 2023-05-16: 324 mg via ORAL
  Filled 2023-05-16: qty 4

## 2023-05-16 MED ORDER — ENOXAPARIN SODIUM 40 MG/0.4ML IJ SOSY
40.0000 mg | PREFILLED_SYRINGE | INTRAMUSCULAR | Status: DC
  Administered 2023-05-16 – 2023-05-18 (×3): 40 mg via SUBCUTANEOUS
  Filled 2023-05-16 (×3): qty 0.4

## 2023-05-16 MED ORDER — ALBUTEROL SULFATE (2.5 MG/3ML) 0.083% IN NEBU: 2.5000 mg | INHALATION_SOLUTION | RESPIRATORY_TRACT | Status: DC | PRN

## 2023-05-16 MED ORDER — OXYCODONE-ACETAMINOPHEN 5-325 MG PO TABS
1.0000 | ORAL_TABLET | ORAL | Status: DC | PRN
  Administered 2023-05-16 – 2023-05-17 (×2): 1 via ORAL
  Filled 2023-05-16 (×2): qty 1

## 2023-05-16 MED ORDER — ONDANSETRON HCL 4 MG/2ML IJ SOLN: 4.0000 mg | Freq: Three times a day (TID) | INTRAMUSCULAR | Status: DC | PRN

## 2023-05-16 MED ORDER — METHOCARBAMOL 500 MG PO TABS
500.0000 mg | ORAL_TABLET | Freq: Three times a day (TID) | ORAL | Status: DC | PRN
  Administered 2023-05-16: 500 mg via ORAL
  Filled 2023-05-16: qty 1

## 2023-05-16 MED ORDER — ASPIRIN 81 MG PO CHEW
324.0000 mg | CHEWABLE_TABLET | Freq: Every day | ORAL | Status: DC
  Administered 2023-05-17: 324 mg via ORAL
  Filled 2023-05-16: qty 4

## 2023-05-16 MED ORDER — FUROSEMIDE 10 MG/ML IJ SOLN
40.0000 mg | Freq: Two times a day (BID) | INTRAMUSCULAR | Status: DC
  Administered 2023-05-17 – 2023-05-18 (×3): 40 mg via INTRAVENOUS
  Filled 2023-05-16 (×3): qty 4

## 2023-05-16 MED ORDER — AMLODIPINE BESYLATE 5 MG PO TABS
5.0000 mg | ORAL_TABLET | Freq: Every day | ORAL | Status: DC
  Administered 2023-05-16 – 2023-05-17 (×2): 5 mg via ORAL
  Filled 2023-05-16 (×2): qty 1

## 2023-05-16 MED ORDER — NITROGLYCERIN 0.4 MG SL SUBL
0.4000 mg | SUBLINGUAL_TABLET | SUBLINGUAL | Status: DC | PRN
  Administered 2023-05-16: 0.4 mg via SUBLINGUAL
  Filled 2023-05-16: qty 1

## 2023-05-16 MED ORDER — FUROSEMIDE 10 MG/ML IJ SOLN
40.0000 mg | Freq: Once | INTRAMUSCULAR | Status: AC
  Administered 2023-05-16: 40 mg via INTRAVENOUS
  Filled 2023-05-16: qty 4

## 2023-05-16 MED ORDER — DM-GUAIFENESIN ER 30-600 MG PO TB12
1.0000 | ORAL_TABLET | Freq: Two times a day (BID) | ORAL | Status: DC | PRN
  Administered 2023-05-17: 1 via ORAL
  Filled 2023-05-16: qty 1

## 2023-05-16 MED ORDER — HYDRALAZINE HCL 20 MG/ML IJ SOLN: 5.0000 mg | INTRAMUSCULAR | Status: DC | PRN

## 2023-05-16 MED ORDER — ACETAMINOPHEN 325 MG PO TABS
650.0000 mg | ORAL_TABLET | Freq: Four times a day (QID) | ORAL | Status: DC | PRN
  Administered 2023-05-17: 650 mg via ORAL
  Filled 2023-05-16: qty 2

## 2023-05-16 NOTE — ED Provider Triage Note (Signed)
 Emergency Medicine Provider Triage Evaluation Note  Jeffrey Ramsey , a 63 y.o. male  was evaluated in triage.  Pt complains of increasing shortness of breath. This has been happening for a couple months. No CP. Patient has not had medical cares in the last several decades. He has noted some lower extremity edema bilaterally  Review of Systems  Positive: Shortness of breath Negative: Chest pain  Physical Exam  BP (!) 153/101 (BP Location: Left Arm)   Pulse (!) 108   Temp 97.8 F (36.6 C) (Oral)   Resp (!) 23   Ht 5\' 9"  (1.753 m)   Wt 74.8 kg   SpO2 97%   BMI 24.37 kg/m  Gen:   Awake, no distress   Resp:  Normal effort  MSK:   Moves extremities without difficulty  Other:    Medical Decision Making  Medically screening exam initiated at 3:36 PM.  Appropriate orders placed.  [name] was informed that the remainder of the evaluation will be completed by another provider, this initial triage assessment does not replace that evaluation, and the importance of remaining in the ED until their evaluation is complete.  Patient will have xray, labs, ekg   Racheal Patches, PA-C 05/16/23 1536

## 2023-05-16 NOTE — ED Provider Notes (Signed)
 [facility] Provider Note    Event Date/Time   First MD Initiated Contact with Patient 05/16/23 1752     (approximate)   History   Shortness of Breath   HPI  Jeffrey Ramsey is a 63 y.o. male past medical history significant for tobacco use, cocaine use, presents to the emergency department with shortness of breath.  Progressively worsening shortness of breath and chest pain that has been ongoing for the past couple of months.  States that it got to the point this week that he is unable to sleep even sitting up.  Complaining of chest pain that is worse with his breathing.  Denies any pain that is worse with deep inspiration.  Swelling to his feet but denies any unilateral leg swelling.  No recent travel.  No recent hospitalizations.  No history of DVT or PE.  Does endorse daily tobacco use.  Also endorses cocaine use and last use of cocaine was within the past 3 days.  Family history of his brother who died at age 65 from heart failure.  Patient does not follow with a primary care physician.  Denies any IV drug use.  Does endorse marijuana use.     Physical Exam   Triage Vital Signs: ED Triage Vitals  Encounter Vitals Group     BP 05/16/23 1533 (!) 153/101     Systolic BP Percentile --      Diastolic BP Percentile --      Pulse Rate 05/16/23 1533 (!) 108     Resp 05/16/23 1533 (!) 23     Temp 05/16/23 1533 97.8 F (36.6 C)     Temp Source 05/16/23 1533 Oral     SpO2 05/16/23 1533 97 %     Weight 05/16/23 1535 165 lb (74.8 kg)     Height 05/16/23 1535 5\' 9"  (1.753 m)     Head Circumference --      Peak Flow --      Pain Score 05/16/23 1535 0     Pain Loc --      Pain Education --      Exclude from Growth Chart --     Most recent vital signs: Vitals:   05/16/23 1533 05/16/23 1534  BP: (!) 153/101   Pulse: (!) 108   Resp: (!) 23 (!) 23  Temp: 97.8 F (36.6 C)   SpO2: 97%     Physical Exam Constitutional:      Appearance: He is  well-developed. He is ill-appearing.  HENT:     Head: Atraumatic.  Eyes:     Conjunctiva/sclera: Conjunctivae normal.  Cardiovascular:     Rate and Rhythm: Regular rhythm. Tachycardia present.  Pulmonary:     Effort: Tachypnea, accessory muscle usage and respiratory distress present.     Breath sounds: Examination of the right-lower field reveals wheezing and rales. Examination of the left-lower field reveals wheezing and rales. Wheezing and rales present.  Chest:     Chest wall: No tenderness.  Musculoskeletal:     Cervical back: Normal range of motion.     Right lower leg: No edema.     Left lower leg: No edema.  Skin:    General: Skin is warm.     Capillary Refill: Capillary refill takes less than 2 seconds.  Neurological:     General: No focal deficit present.     Mental Status: He is alert. Mental status is at baseline.  Psychiatric:  Mood and Affect: Mood normal.     IMPRESSION / MDM / ASSESSMENT AND PLAN / ED COURSE  I reviewed the triage vital signs and the nursing notes.  Differential diagnosis including ACS, heart failure, pneumonia, COPD, anemia, dysrhythmia, pulmonary embolism, chest pain secondary to cocaine use and vasospasm  EKG  I, Corena Herter, the attending physician, personally viewed and interpreted this ECG.  Initial EKG was read as sinus tachycardia.  Normal intervals.  No significant ST elevation or depression.  Repeat EKG is reading as atrial flutter with AV block.  Does have P waves present.  Believe it is counting flutter waves as the T waves.  QTc is 49.  Signs of LVH.  Nonspecific ST changes.  Tachycardic on cardiac telemetry  RADIOLOGY I independently reviewed imaging, my interpretation of imaging: Chest x-ray with cardiomegaly without significant pulmonary edema.  No obvious pneumonia on my evaluation and read is currently pending  LABS (all labs ordered are listed, but only abnormal results are displayed) Labs interpreted as -     Labs Reviewed  COMPREHENSIVE METABOLIC PANEL - Abnormal; Notable for the following components:      Result Value   BUN 27 (*)    Creatinine, Ser 1.34 (*)    Calcium 8.6 (*)    ALT 69 (*)    GFR, Estimated 60 (*)    All other components within normal limits  BRAIN NATRIURETIC PEPTIDE - Abnormal; Notable for the following components:   B Natriuretic Peptide 1,196.1 (*)    All other components within normal limits  TROPONIN I (HIGH SENSITIVITY) - Abnormal; Notable for the following components:   Troponin I (High Sensitivity) 31 (*)    All other components within normal limits  CBC WITH DIFFERENTIAL/PLATELET  LIPASE, BLOOD  TROPONIN I (HIGH SENSITIVITY)     MDM  Lab work notable for elevation of creatinine up to 1.3 from a baseline of 9.  Elevated troponin of 31 and significantly elevated BNP in the 1000's.  No significant anemia or leukocytosis.  Patient was given aspirin and nitroglycerin for chest pain.  Started on IV Lasix for concern for new onset heart failure.  Have a low suspicion for pulmonary embolism, low risk Wells criteria.  Patient also endorses cocaine use, will do aspirin and nitroglycerin for chest pain, if ongoing pain we will try benzodiazepine given concern for possible vasospasm.  Consulted hospitalist for admission for new onset heart failure     PROCEDURES:  Critical Care performed: yes  .Critical Care  Performed by: Corena Herter, MD Authorized by: Corena Herter, MD   Critical care provider statement:    Critical care time (minutes):  40   Critical care time was exclusive of:  Separately billable procedures and treating other patients   Critical care was necessary to treat or prevent imminent or life-threatening deterioration of the following conditions:  Cardiac failure   Critical care was time spent personally by me on the following activities:  Development of treatment plan with patient or surrogate, discussions with consultants, evaluation  of patient's response to treatment, examination of patient, ordering and review of laboratory studies, ordering and review of radiographic studies, ordering and performing treatments and interventions, pulse oximetry, re-evaluation of patient's condition and review of old charts   Care discussed with: admitting provider     Patient's presentation is most consistent with acute presentation with potential threat to life or bodily function.   MEDICATIONS ORDERED IN ED: Medications  nitroGLYCERIN (NITROSTAT) SL tablet 0.4 mg (has  no administration in time range)  aspirin chewable tablet 324 mg (324 mg Oral Given 05/16/23 1812)  furosemide (LASIX) injection 40 mg (40 mg Intravenous Given 05/16/23 1824)    FINAL CLINICAL IMPRESSION(S) / ED DIAGNOSES   Final diagnoses:  SOB (shortness of breath)  Acute congestive heart failure, unspecified heart failure type (HCC)  Elevated troponin  Cocaine use  Tobacco use     Rx / DC Orders   ED Discharge Orders     None        Note:  This document was prepared using Dragon voice recognition software and may include unintentional dictation errors.   Corena Herter, MD 05/16/23 [phone]

## 2023-05-16 NOTE — ED Triage Notes (Addendum)
 Patient c/o sob that worsens with ambulation. C/o dizziness. States this has been going on over a month.   Patient also c/o redness and sores on nose and area on right wrist he believes is MRSA  Everyday smoker

## 2023-05-16 NOTE — H&P (Addendum)
 History and Physical    Jeffrey Ramsey DOB: 28-Jun-1960 DOA: 05/16/2023  Referring MD/NP/PA:   PCP: Patient, No Pcp Per   Patient coming from:  The patient is coming from home.     Chief Complaint: SOB  HPI: Jeffrey Ramsey is a 63 y.o. male with medical history significant of former smoker, cocaine abuse, who presents with shortness of breath.  Patient states that he has SOB for nearly 2 months, which has worsened in the past several days.  He has orthopnea, cannot sleep flat comfortably.  He has dry cough, no fever or chills.  He had chest pressure earlier, which has resolved.  Currently denies active chest pain.  No nausea, vomiting, diarrhea or abdominal pain.  No symptoms of UTI.  Patient states that he used cocaine 3 days ago.  Recent long distance traveling.  No tenderness in the calf artery.  No rectal bleeding or dark stool.  Data reviewed independently and ED Course: pt was found to have troponin 31, BNP 1196, WBC 8.5, AKI with creatinine 1.34, BUN 27, GFR 60 (baseline creatinine 0.96).  Temperature normal, blood pressure 160/115, heart rate 114, RR 23, oxygen saturation 99% on room air.  Patient is admitted to telemetry bed as inpatient.  Dr. Melton Alar of card is consulted.  Chest x-ray: 1. New dense focal opacity along the major fissure seen on the lateral view, possibly loculated fluid. Recommend further evaluation with CT. 2. Small layering left pleural effusion. 3. Peribronchial wall thickening in the hilar regions bilaterally, likely infectious/inflammatory.   EKG: I have personally reviewed.  Sinus rhythm, QTc 467, bilateral atrial enlargement.   Review of Systems:   General: no fevers, chills, no body weight gain,  has fatigue HEENT: no blurry vision, hearing changes or sore throat Respiratory: has dyspnea, coughing, no wheezing CV: had chest pressure, no palpitations GI: no nausea, vomiting, abdominal pain, diarrhea, constipation GU: no dysuria,  burning on urination, increased urinary frequency, hematuria  Ext: Has ankle edema Neuro: no unilateral weakness, numbness, or tingling, no vision change or hearing loss Skin: no rash, no skin tear. MSK: no deformity, no limitation of range of movement in spin Heme: No easy bruising.  Travel history: No recent long distant travel.   Allergy:  Allergies  Allergen Reactions   Penicillins     Past Medical History:  Diagnosis Date   Cocaine abuse (HCC)    Tobacco abuse     Past Surgical History:  Procedure Laterality Date   ABDOMINAL SURGERY     ORIF PATELLA Left 08/18/2020   Procedure: OPEN REDUCTION INTERNAL (ORIF) FIXATION PATELLA;  Surgeon: Roby Lofts, MD;  Location: [facility] OR;  Service: Orthopedics;  Laterality: Left;    Social History:  reports that he has quit smoking. His smoking use included cigarettes. He has never used smokeless tobacco. He reports current drug use. Drugs: Marijuana and Codeine. He reports that he does not drink alcohol.  Family History:  Family History  Problem Relation Age of Onset   Heart disease Mother    Heart failure Brother      Prior to Admission medications   Medication Sig Start Date End Date Taking? Authorizing Provider  acetaminophen (TYLENOL) 325 MG tablet Take 2 tablets (650 mg total) by mouth every 6 (six) hours as needed for mild pain. 08/19/20   Meuth, Lina Sar, PA-C  methocarbamol (ROBAXIN) 750 MG tablet Take 1 tablet (750 mg total) by mouth every 8 (eight) hours as needed for muscle spasms.  08/19/20   Meuth, Lina Sar, PA-C  Oxycodone HCl 10 MG TABS Take 0.5-1 tablets (5-10 mg total) by mouth every 6 (six) hours as needed for moderate pain or severe pain (5 mg moderate pain, 10 mg severe pain). 08/19/20   Meuth, Brooke A, PA-C  polyethylene glycol (MIRALAX / GLYCOLAX) 17 g packet Take 17 g by mouth daily as needed for mild constipation. 08/19/20   Franne Forts, PA-C    Physical Exam: Vitals:   05/16/23 1534 05/16/23 1535 05/16/23  1825 05/16/23 1900  BP:   (!) 160/115 (!) 156/103  Pulse:   (!) 114 (!) 115  Resp: (!) 23  18 20   Temp:      TempSrc:      SpO2:   99% 93%  Weight:  74.8 kg    Height:  5\' 9"  (1.753 m)     General: Not in acute distress HEENT:       Eyes: PERRL, EOMI, no jaundice       ENT: No discharge from the ears and nose, no pharynx injection, no tonsillar enlargement.        Neck: Positive JVD, no bruit, no mass felt. Heme: No neck lymph node enlargement. Cardiac: S1/S2, RRR, No murmurs, No gallops or rubs. Respiratory: Fine crackles bilaterally GI: Soft, nondistended, nontender, no rebound pain, no organomegaly, BS present. GU: No hematuria Ext: Has mild ankle edema bilaterally. 1+DP/PT pulse bilaterally. Musculoskeletal: No joint deformities, No joint redness or warmth, no limitation of ROM in spin. Skin: No rashes.  Neuro: Alert, oriented X3, cranial nerves II-XII grossly intact, moves all extremities normally.  Psych: Patient is not psychotic, no suicidal or hemocidal ideation.  Labs on Admission: I have personally reviewed following labs and imaging studies  CBC: Recent Labs  Lab 05/16/23 1538  WBC 8.5  NEUTROABS 6.2  HGB 14.2  HCT 43.2  MCV 92.7  PLT 246   Basic Metabolic Panel: Recent Labs  Lab 05/16/23 1538  NA 139  K 4.5  CL 110  CO2 24  GLUCOSE 92  BUN 27*  CREATININE 1.34*  CALCIUM 8.6*   GFR: Estimated Creatinine Clearance: 57.2 mL/min (A) (by C-G formula based on SCr of 1.34 mg/dL (H)). Liver Function Tests: Recent Labs  Lab 05/16/23 1538  AST 29  ALT 69*  ALKPHOS 101  BILITOT 0.7  PROT 6.6  ALBUMIN 3.6   Recent Labs  Lab 05/16/23 1538  LIPASE 28   No results for input(s): "AMMONIA" in the last 168 hours. Coagulation Profile: No results for input(s): "INR", "PROTIME" in the last 168 hours. Cardiac Enzymes: No results for input(s): "CKTOTAL", "CKMB", "CKMBINDEX", "TROPONINI" in the last 168 hours. BNP (last 3 results) No results for  input(s): "PROBNP" in the last 8760 hours. HbA1C: No results for input(s): "HGBA1C" in the last 72 hours. CBG: No results for input(s): "GLUCAP" in the last 168 hours. Lipid Profile: No results for input(s): "CHOL", "HDL", "LDLCALC", "TRIG", "CHOLHDL", "LDLDIRECT" in the last 72 hours. Thyroid Function Tests: No results for input(s): "TSH", "T4TOTAL", "FREET4", "T3FREE", "THYROIDAB" in the last 72 hours. Anemia Panel: No results for input(s): "VITAMINB12", "FOLATE", "FERRITIN", "TIBC", "IRON", "RETICCTPCT" in the last 72 hours. Urine analysis:    Component Value Date/Time   COLORURINE AMBER (A) 08/15/2020 1951   APPEARANCEUR CLOUDY (A) 08/15/2020 1951   LABSPEC 1.032 (H) 08/15/2020 1951   PHURINE 5.0 08/15/2020 1951   GLUCOSEU NEGATIVE 08/15/2020 1951   HGBUR LARGE (A) 08/15/2020 1951   BILIRUBINUR NEGATIVE 08/15/2020  1951   KETONESUR NEGATIVE 08/15/2020 1951   PROTEINUR 100 (A) 08/15/2020 1951   NITRITE NEGATIVE 08/15/2020 1951   LEUKOCYTESUR NEGATIVE 08/15/2020 1951   Sepsis Labs: @LABRCNTIP (procalcitonin:4,lacticidven:4) ) Recent Results (from the past 240 hours)  SARS Coronavirus 2 by RT PCR (hospital order, performed in [facility] hospital lab) *cepheid single result test* Anterior Nasal Swab     Status: None   Collection Time: 05/16/23  7:41 PM   Specimen: Anterior Nasal Swab  Result Value Ref Range Status   SARS Coronavirus 2 by RT PCR NEGATIVE NEGATIVE Final    Comment: (NOTE) SARS-CoV-2 target nucleic acids are NOT DETECTED.  The SARS-CoV-2 RNA is generally detectable in upper and lower respiratory specimens during the acute phase of infection. The lowest concentration of SARS-CoV-2 viral copies this assay can detect is 250 copies / mL. A negative result does not preclude SARS-CoV-2 infection and should not be used as the sole basis for treatment or other patient management decisions.  A negative result may occur with improper specimen collection / handling,  submission of specimen other than nasopharyngeal swab, presence of viral mutation(s) within the areas targeted by this assay, and inadequate number of viral copies (<250 copies / mL). A negative result must be combined with clinical observations, patient history, and epidemiological information.  Fact Sheet for Patients:   RoadLapTop.co.za  Fact Sheet for Healthcare Providers: http://kim-miller.com/  This test is not yet approved or  cleared by the [place] FDA and has been authorized for detection and/or diagnosis of SARS-CoV-2 by FDA under an Emergency Use Authorization (EUA).  This EUA will remain in effect (meaning this test can be used) for the duration of the COVID-19 declaration under Section 564(b)(1) of the Act, 21 U.S.C. section 360bbb-3(b)(1), unless the authorization is terminated or revoked sooner.  Performed at [facility], [address], [place], [place] [address]      Radiological Exams on Admission:   Assessment/Plan Principal Problem:   Acute CHF [facility]) Active Problems:   Myocardial injury   HTN (hypertension)   AKI (acute kidney injury) (HCC)   Tobacco abuse   Cocaine abuse (HCC)   Assessment and Plan:  Acute CHF [facility]): Patient has ankle edema, SOB, orthopnea, significantly elevated BNP 1196, positive JVD, crackles on auscultation, clinically consistent with acute onset CHF.  No 2D echo on record, not sure which type of CHF.  Will get 2D echo for further evaluation.  Consulted Dr. Melton Alar of card.  -Will admit to tele bed   as inpatient -Lasix 40 mg bid by IV -2d echo -Daily weights -strict I/O's -Low salt diet -Fluid restriction -As needed bronchodilators for shortness of breath  Myocardial injury: Troponin 31.  Patient had chest pressure earlier, which has resolved.  Currently no active chest pain.  Likely due to cocaine abuse and demand ischemia. -Trend troponin -Aspirin -As  needed Percocet, and Tylenol for pain -Follow-up 2D echo -Check A1c, FLP -Check UDS  Hypertension: Blood pressure 160/115.  Patient does not have history of hypertension. -Start amlodipine 5 mg daily -IV hydralazine as needed  AKI (acute kidney injury) (HCC): Baseline creatinine 0.96 recently.  His creatinine is 1.34, BUN 27, GFR 60.  May be due to cardiorenal syndrome. -Avoid using renal toxic medications. -Follow-up renal function by BMP  Tobacco abuse: Patient states that he intermittently stopped smoking, and stopped smoking recently.  Patient does not need nicotine patch. -Did counseling about importance of quitting smoking  Cocaine abuse (HCC) -Did counseling about importance  of quitting substance use -Check UDS        DVT ppx: SQ Lovenox  Code Status: Full code    Family Communication:     not done, no family member is at bed side.     Disposition Plan:  Anticipate discharge back to previous environment  Consults called:  Dr. Ronney Asters of card  Admission status and Level of care: Telemetry Cardiac:  as inpt        Dispo: The patient is from: Home              Anticipated d/c is to: Home              Anticipated d/c date is: 2 days              Patient currently is not medically stable to d/c.    Severity of Illness:  The appropriate patient status for this patient is INPATIENT. Inpatient status is judged to be reasonable and necessary in order to provide the required intensity of service to ensure the patient's safety. The patient's presenting symptoms, physical exam findings, and initial radiographic and laboratory data in the context of their chronic comorbidities is felt to place them at high risk for further clinical deterioration. Furthermore, it is not anticipated that the patient will be medically stable for discharge from the hospital within 2 midnights of admission.   * I certify that at the point of admission it is my clinical judgment that the  patient will require inpatient hospital care spanning beyond 2 midnights from the point of admission due to high intensity of service, high risk for further deterioration and high frequency of surveillance required.*       Date of Service 05/16/2023    Lorretta Harp [facility]   If 7PM-7AM, please contact night-coverage [email or web address] 05/16/2023, 8:50 PM

## 2023-05-16 NOTE — ED Notes (Signed)
 This RN received report from Georgann Housekeeper RN and performed bedside care handoff. This RN introduced self to pt. Call light in reach, bed wheels locked, side rail raised, pt updated on plan of care. Rounding completed.

## 2023-05-17 ENCOUNTER — Inpatient Hospital Stay (HOSPITAL_COMMUNITY)
Admit: 2023-05-17 | Discharge: 2023-05-17 | Disposition: A | Discharge status: Home / Self Care | Payer: Self-pay | Attending: Internal Medicine | Admitting: Internal Medicine

## 2023-05-17 DIAGNOSIS — I10 Essential (primary) hypertension: Secondary | ICD-10-CM

## 2023-05-17 DIAGNOSIS — I5021 Acute systolic (congestive) heart failure: Secondary | ICD-10-CM

## 2023-05-17 LAB — ECHOCARDIOGRAM COMPLETE
AR max vel: 3.09 cm2
AV Peak grad: 2.8 mmHg
Ao pk vel: 0.84 m/s
Area-P 1/2: 5.97 cm2
Calc EF: 39.7 %
Height: 69 in
MV M vel: 4.52 m/s
MV Peak grad: 81.7 mmHg
P 1/2 time: 379 ms
S' Lateral: 4.78 cm
Single Plane A2C EF: 43.3 %
Single Plane A4C EF: 35.2 %
Weight: 2640 [oz_av]

## 2023-05-17 LAB — BASIC METABOLIC PANEL
Anion gap: 9 (ref 5–15)
BUN: 26 mg/dL — ABNORMAL HIGH (ref 8–23)
CO2: 22 mmol/L (ref 22–32)
Calcium: 8.5 mg/dL — ABNORMAL LOW (ref 8.9–10.3)
Chloride: 106 mmol/L (ref 98–111)
Creatinine, Ser: 1.27 mg/dL — ABNORMAL HIGH (ref 0.61–1.24)
GFR, Estimated: >60 mL/min (ref >60)
Glucose, Bld: 91 mg/dL (ref 70–99)
Potassium: 3.7 mmol/L (ref 3.5–5.1)
Sodium: 137 mmol/L (ref 135–145)

## 2023-05-17 LAB — LIPID PANEL
Cholesterol: 118 mg/dL (ref 0–200)
HDL: 37 mg/dL — ABNORMAL LOW (ref >40)
LDL Cholesterol: 68 mg/dL (ref 0–99)
Total CHOL/HDL Ratio: 3.2 ratio
Triglycerides: 66 mg/dL (ref <150)
VLDL: 13 mg/dL (ref 0–40)

## 2023-05-17 LAB — TROPONIN I (HIGH SENSITIVITY)
Troponin I (High Sensitivity): 33 ng/L — ABNORMAL HIGH (ref <18)
Troponin I (High Sensitivity): 40 ng/L — ABNORMAL HIGH (ref <18)
Troponin I (High Sensitivity): 39 ng/L — ABNORMAL HIGH (ref <18)

## 2023-05-17 LAB — HIV ANTIBODY (ROUTINE TESTING W REFLEX): HIV Screen 4th Generation wRfx: NONREACTIVE

## 2023-05-17 LAB — D-DIMER, QUANTITATIVE: D-Dimer, Quant: 0.74 ug{FEU}/mL — ABNORMAL HIGH (ref 0.00–0.50)

## 2023-05-17 MED ORDER — POTASSIUM CHLORIDE 20 MEQ PO PACK
40.0000 meq | PACK | Freq: Every day | ORAL | Status: DC
  Administered 2023-05-17 – 2023-05-18 (×2): 40 meq via ORAL
  Filled 2023-05-17 (×3): qty 2

## 2023-05-17 MED ORDER — ASPIRIN 81 MG PO CHEW
81.0000 mg | CHEWABLE_TABLET | Freq: Every day | ORAL | Status: DC
  Administered 2023-05-18 – 2023-05-19 (×2): 81 mg via ORAL
  Filled 2023-05-17 (×2): qty 1

## 2023-05-17 MED ORDER — CYCLOBENZAPRINE HCL 10 MG PO TABS
10.0000 mg | ORAL_TABLET | Freq: Three times a day (TID) | ORAL | Status: DC | PRN
  Administered 2023-05-17 (×3): 10 mg via ORAL
  Filled 2023-05-17 (×3): qty 1

## 2023-05-17 NOTE — TOC CM/SW Note (Signed)
 CSW visited patient room and introduced self to patient.  Patient is CHF consult, however, CSW provided patient with a list of PCP providers and an application for the [facility].  CSW signing off.

## 2023-05-17 NOTE — Consult Note (Signed)
 [facility] CARDIOLOGY CONSULT NOTE       Patient ID: Jeffrey Ramsey MRN: [id number] DOB/AGE: 06/12/1960 63 y.o.  Admit date: 05/16/2023 Referring Physician Dr. Lorretta Harp Primary Physician Patient, No Pcp Per  Primary Cardiologist none  Reason for Consultation AoCHF  HPI: Jeffrey Ramsey is a 63 y.o. male  with a past medical history of cocaine use, tobacco use who presented to the ED on 05/16/2023 for shortness of breath. BNP elevated. Cardiology was consulted for further evaluation.   Patient reports that for the last few weeks he has been having worsening shortness of breath symptoms.  States that he is having difficulty sleeping and has been having to sit up to sleep.  Also endorses some associated chest tightness.  Given his symptoms have progressively worsened he decided to come to the ED for further evaluation.  Workup in the ED notable for creatinine 1.34, potassium 4.5, hemoglobin 14.2, WBC 8.5.  BNP elevated at 1196.  Troponins trended 31 > 28 > 33 > 40 > 39.  He was started on IV Lasix in the ED.  At the time of my evaluation this morning patient is resting comfortably in hospital bed.  We discussed his symptoms in further detail.  He reports noticing some lower extremity edema but nothing significant.  Also endorses occasional palpitations and lightheadedness.  Overall he reports great urine output since he arrived to the hospital and is overall feeling much better.  Feels his breathing has improved and has been able to lay back some and rest.  He is not on any supplemental O2.  Review of systems complete and found to be negative unless listed above    Past Medical History:  Diagnosis Date   Cocaine abuse (HCC)    Tobacco abuse     Past Surgical History:  Procedure Laterality Date   ABDOMINAL SURGERY     ORIF PATELLA Left 08/18/2020   Procedure: OPEN REDUCTION INTERNAL (ORIF) FIXATION PATELLA;  Surgeon: Roby Lofts, MD;  Location: [facility] OR;  Service: Orthopedics;   Laterality: Left;    (Not in a hospital admission)  Social History   Socioeconomic History   Marital status: Single    Spouse name: Not on file   Number of children: Not on file   Years of education: Not on file   Highest education level: Not on file  Occupational History   Not on file  Tobacco Use   Smoking status: Former    Current packs/day: 1.00    Types: Cigarettes   Smokeless tobacco: Never  Vaping Use   Vaping status: Never Used  Substance and Sexual Activity   Alcohol use: No   Drug use: Yes    Types: Marijuana, Codeine   Sexual activity: Not on file  Other Topics Concern   Not on file  Social History Narrative   Not on file   Social Drivers of Health   Financial Resource Strain: Not on file  Food Insecurity: No Food Insecurity (05/17/2023)   Hunger Vital Sign    Worried About Running Out of Food in the Last Year: Never true    Ran Out of Food in the Last Year: Never true  Transportation Needs: No Transportation Needs (05/17/2023)   PRAPARE - Administrator, Civil Service (Medical): No    Lack of Transportation (Non-Medical): No  Physical Activity: Not on file  Stress: Not on file  Social Connections: Not on file  Intimate Partner Violence: Not At Risk (05/17/2023)  Humiliation, Afraid, Rape, and Kick questionnaire    Fear of Current or Ex-Partner: No    Emotionally Abused: No    Physically Abused: No    Sexually Abused: No    Family History  Problem Relation Age of Onset   Heart disease Mother    Heart failure Brother      Vitals:   05/17/23 0630 05/17/23 0855 05/17/23 0937 05/17/23 1200  BP: (!) 139/92  130/82 138/74  Pulse: 98  (!) 113 (!) 108  Resp: 20  (!) 21 (!) 22  Temp:  98 F (36.7 C)  98.4 F (36.9 C)  TempSrc:  Oral  Oral  SpO2: 95%  96% 95%  Weight:      Height:        PHYSICAL EXAM General: Well appearing male, well nourished, in no acute distress. HEENT: Normocephalic and atraumatic. Neck: No JVD.  Lungs:  Normal respiratory effort on room air. + crackles.  Heart: HRRR. Normal S1 and S2 without gallops or murmurs.  Abdomen: Non-distended appearing.  Msk: Normal strength and tone for age. Extremities: Warm and well perfused. No clubbing, cyanosis. Trace edema.  Neuro: Alert and oriented X 3. Psych: Answers questions appropriately.   Labs: Basic Metabolic Panel: Recent Labs    05/16/23 1538 05/16/23 2057 05/17/23 0215  NA 139  --  137  K 4.5  --  3.7  CL 110  --  106  CO2 24  --  22  GLUCOSE 92  --  91  BUN 27*  --  26*  CREATININE 1.34*  --  1.27*  CALCIUM 8.6*  --  8.5*  MG  --  2.1  --    Liver Function Tests: Recent Labs    05/16/23 1538  AST 29  ALT 69*  ALKPHOS 101  BILITOT 0.7  PROT 6.6  ALBUMIN 3.6   Recent Labs    05/16/23 1538  LIPASE 28   CBC: Recent Labs    05/16/23 1538  WBC 8.5  NEUTROABS 6.2  HGB 14.2  HCT 43.2  MCV 92.7  PLT 246   Cardiac Enzymes: Recent Labs    05/16/23 2056 05/17/23 0215 05/17/23 0422  TROPONINIHS 33* 40* 39*   BNP: Recent Labs    05/16/23 1538  BNP 1,196.1*   D-Dimer: Recent Labs    05/17/23 0215  DDIMER 0.74*   Hemoglobin A1C: Recent Labs    05/16/23 2057  HGBA1C 5.8*   Fasting Lipid Panel: Recent Labs    05/17/23 0215  CHOL 118  HDL 37*  LDLCALC 68  TRIG 66  CHOLHDL 3.2   Thyroid Function Tests: No results for input(s): "TSH", "T4TOTAL", "T3FREE", "THYROIDAB" in the last 72 hours.  Invalid input(s): "FREET3" Anemia Panel: No results for input(s): "VITAMINB12", "FOLATE", "FERRITIN", "TIBC", "IRON", "RETICCTPCT" in the last 72 hours.   Radiology: DG Chest 2 View Result Date: 05/16/2023 CLINICAL DATA:  Shortness of breath EXAM: CHEST - 2 VIEW COMPARISON:  Chest x-ray 08/14/2020 FINDINGS: There is peribronchial wall thickening in the hilar regions bilaterally. There is new dense focal opacity along the major fissure seen on the lateral view, possibly loculated fluid. The cardiomediastinal  silhouette is within normal limits. There is a small layering left pleural effusion. There is no pneumothorax or acute fracture. IMPRESSION: 1. New dense focal opacity along the major fissure seen on the lateral view, possibly loculated fluid. Recommend further evaluation with CT. 2. Small layering left pleural effusion. 3. Peribronchial wall thickening in the hilar regions  bilaterally, likely infectious/inflammatory. Electronically Signed   By: Darliss Cheney M.D.   On: 05/16/2023 18:45    ECHO ordered  TELEMETRY reviewed by me 05/17/2023: sinus tach 100s  EKG reviewed by me: sinus tachycardia rate 111 bpm  Data reviewed by me 05/17/2023: last 24h vitals tele labs imaging I/O ED provider note, admission H&P  Principal Problem:   Acute CHF (HCC) Active Problems:   Tobacco abuse   Cocaine abuse (HCC)   Myocardial injury   AKI (acute kidney injury) (HCC)   HTN (hypertension)    ASSESSMENT AND PLAN:  Jeffrey Ramsey is a 63 y.o. male  with a past medical history of cocaine use, tobacco use who presented to the ED on 05/16/2023 for shortness of breath. BNP elevated. Cardiology was consulted for further evaluation.   # Acute congestive heart failure # Cocaine use # Demand ischemia Patient presented with worsening shortness of breath for the last few weeks with associated orthopnea.  BNP found to be elevated at nearly 1200.  Started on IV diuresis in the ED. -Echo ordered.  Further recommendations for medications pending these results.  -Continue IV Lasix 40 mg twice daily. -Monitor renal function closely with diuresis.  Monitor electrolytes and supplement as needed. -Continue amlodipine 5 mg daily.  -Mildly elevated and flat trending troponin most consistent with demand/supply mismatch and not ACS in the setting of AoCHF and cocaine use. -No plan for further cardiac diagnostics at this time. -Patient advised of risk associated with cocaine use and cessation was strongly advised.   This  patient's plan of care was discussed and created with Dr. Melton Alar and she is in agreement.  Signed: Gale Journey, PA-C  05/17/2023, 2:56 PM [facility]

## 2023-05-17 NOTE — ED Notes (Signed)
 Confirmed with ccmd pt on cardiac monitor

## 2023-05-17 NOTE — Progress Notes (Signed)
 Echocardiogram 2D Echocardiogram has been performed.  Lucendia Herrlich 05/17/2023, 5:47 PM

## 2023-05-17 NOTE — Progress Notes (Signed)
 Progress Note   [name] AOZ:[id number] DOB: 1961/01/08 DOA: 05/16/2023     1 DOS: the patient was seen and examined on 05/17/2023   Brief hospital course: Jeffrey Ramsey is a 63 y.o. male with medical history significant of former smoker, cocaine abuse, who presents with shortness of breath.  His BNP is low at 96, was tachycardic, tachypneic upon arrival, chest x-ray showed small layering left pleural effusion, major fissure fluid.  U tox positive for cocaine.  Patient is admitted to hospitalist service for further management evaluation of new CHF exacerbation.  Assessment and Plan: Acute CHF: Unknown whether diastolic or systolic. Continue IV Lasix 40 mg twice daily. 2D echocardiogram pending. Monitor daily weights, strict input and output. Low-salt diet, fluid restriction advised. CHF instructions provided. Cardiology evaluation appreciated. Monitor daily renal function, electrolytes and replace accordingly.  Type II MI in the setting of CHF. He denies any chest discomfort currently. EKG sinus rhythm.  No acute ST-T wave changes. Will follow echocardiogram. A1c 5.8, LDL 68. Will follow cardiology recommendations.  AKI: Kidney function improving. Continue to monitor daily renal function as he is Lasix therapy. Avoid other nephrotoxic drugs.  Hypertension: Patient's BP consistently more than 130. Patient is started on amlodipine 5 mg. Continue IV hydralazine as needed.  Polysubstance abuse: Educated regarding ill effects of illicit drugs. Advised to quit smoking, stop using cocaine. He refuses nicotine patch at this time. Smoking cessation counseled.     Out of bed to chair. Incentive spirometry. Nursing supportive care. Fall, aspiration precautions. DVT prophylaxis   Code Status: Full Code  Subjective: Patient is seen and examined today morning in the emergency department.  He complains of severe leg cramps, unable to get out of bed.  Patient has been  diuresing well, has no chest pain or shortness of breath.  Wishes to go home.  Physical Exam: Vitals:   05/17/23 0630 05/17/23 0855 05/17/23 0937 05/17/23 1200  BP: (!) 139/92  130/82 138/74  Pulse: 98  (!) 113 (!) 108  Resp: 20  (!) 21 (!) 22  Temp:  98 F (36.7 C)  98.4 F (36.9 C)  TempSrc:  Oral  Oral  SpO2: 95%  96% 95%  Weight:      Height:        General - Elderly Caucasian male, no apparent in distress due to leg cramps HEENT - PERRLA, EOMI, atraumatic head, non tender sinuses. Lung - Clear, basal rales, no rhonchi, wheezes. Heart - S1, S2 heard, no murmurs, rubs, trace pedal edema. Abdomen - Soft, non tender, bowel sounds good Neuro - Alert, awake and oriented x 3, non focal exam. Skin - Warm and dry.  Data Reviewed:      Latest Ref Rng & Units 05/16/2023    3:38 PM 08/19/2020    2:18 AM 08/17/2020   11:49 PM  CBC  WBC 4.0 - 10.5 K/uL 8.5  11.0  7.5   Hemoglobin 13.0 - 17.0 g/dL 96.2  95.2  84.1   Hematocrit 39.0 - 52.0 % 43.2  29.6  34.0   Platelets 150 - 400 K/uL 246  172  170       Latest Ref Rng & Units 05/17/2023    2:15 AM 05/16/2023    3:38 PM 08/19/2020    2:18 AM  BMP  Glucose 70 - 99 mg/dL 91  92  324   BUN 8 - 23 mg/dL 26  27  22    Creatinine 0.61 - 1.24 mg/dL  1.27  1.34  0.96   Sodium 135 - 145 mmol/L 137  139  137   Potassium 3.5 - 5.1 mmol/L 3.7  4.5  4.5   Chloride 98 - 111 mmol/L 106  110  106   CO2 22 - 32 mmol/L 22  24  26    Calcium 8.9 - 10.3 mg/dL 8.5  8.6  8.2    DG Chest 2 View Result Date: 05/16/2023 CLINICAL DATA:  Shortness of breath EXAM: CHEST - 2 VIEW COMPARISON:  Chest x-ray 08/14/2020 FINDINGS: There is peribronchial wall thickening in the hilar regions bilaterally. There is new dense focal opacity along the major fissure seen on the lateral view, possibly loculated fluid. The cardiomediastinal silhouette is within normal limits. There is a small layering left pleural effusion. There is no pneumothorax or acute fracture.  IMPRESSION: 1. New dense focal opacity along the major fissure seen on the lateral view, possibly loculated fluid. Recommend further evaluation with CT. 2. Small layering left pleural effusion. 3. Peribronchial wall thickening in the hilar regions bilaterally, likely infectious/inflammatory. Electronically Signed   By: Darliss Cheney M.D.   On: 05/16/2023 18:45    Family Communication: Discussed with patient, he understand and agree. All questions answered.  Disposition: Status is: Inpatient Remains inpatient appropriate because: new CHF on IV Lasix, cardiac work up.  Planned Discharge Destination: Home with Home Health     Time spent: 40 minutes  Author: Marcelino Duster, MD 05/17/2023 2:35 PM Secure chat 7am to 7pm For on call review [email or web address].

## 2023-05-17 NOTE — ED Notes (Signed)
 Pt remains aox4 speaking in full clear sentences appears to be resting quietly skin appears wdi pink non labored resps noted 20g piv to lfa cdi patent not infiltrated @ this time flushed without difficulty blood draw noted pt uses urinal at bs with approx yellow urine output noted

## 2023-05-17 NOTE — Progress Notes (Signed)
 Heart Failure Navigator Progress Note  Assessed for Heart & Vascular TOC clinic readiness.  Patient does not meet criteria due to consult to [facility] , Dr. Melton Alar on 05/16/23.   Navigator will sign off at this time.  Roxy Horseman, RN, BSN [facility] Heart Failure Navigator Secure Chat Only

## 2023-05-18 DIAGNOSIS — I5021 Acute systolic (congestive) heart failure: Secondary | ICD-10-CM

## 2023-05-18 LAB — BASIC METABOLIC PANEL
Anion gap: 10 (ref 5–15)
BUN: 37 mg/dL — ABNORMAL HIGH (ref 8–23)
CO2: 23 mmol/L (ref 22–32)
Calcium: 9 mg/dL (ref 8.9–10.3)
Chloride: 103 mmol/L (ref 98–111)
Creatinine, Ser: 1.53 mg/dL — ABNORMAL HIGH (ref 0.61–1.24)
GFR, Estimated: 51 mL/min — ABNORMAL LOW (ref >60)
Glucose, Bld: 121 mg/dL — ABNORMAL HIGH (ref 70–99)
Potassium: 4.3 mmol/L (ref 3.5–5.1)
Sodium: 136 mmol/L (ref 135–145)

## 2023-05-18 LAB — CBC
HCT: 52 % (ref 39.0–52.0)
Hemoglobin: 17.9 g/dL — ABNORMAL HIGH (ref 13.0–17.0)
MCH: 30 pg (ref 26.0–34.0)
MCHC: 34.4 g/dL (ref 30.0–36.0)
MCV: 87.1 fL (ref 80.0–100.0)
Platelets: 274 10*3/uL (ref 150–400)
RBC: 5.97 MIL/uL — ABNORMAL HIGH (ref 4.22–5.81)
RDW: 13.4 % (ref 11.5–15.5)
WBC: 9 10*3/uL (ref 4.0–10.5)
nRBC: 0 % (ref 0.0–0.2)

## 2023-05-18 LAB — MAGNESIUM: Magnesium: 2.2 mg/dL (ref 1.7–2.4)

## 2023-05-18 MED ORDER — LISINOPRIL 5 MG PO TABS
5.0000 mg | ORAL_TABLET | Freq: Every day | ORAL | Status: DC
  Administered 2023-05-18 – 2023-05-19 (×2): 5 mg via ORAL
  Filled 2023-05-18 (×2): qty 1

## 2023-05-18 MED ORDER — FUROSEMIDE 20 MG PO TABS
20.0000 mg | ORAL_TABLET | Freq: Every day | ORAL | Status: DC
  Administered 2023-05-19: 20 mg via ORAL
  Filled 2023-05-18: qty 1

## 2023-05-18 MED ORDER — CARVEDILOL 3.125 MG PO TABS
3.1250 mg | ORAL_TABLET | Freq: Two times a day (BID) | ORAL | Status: DC
  Administered 2023-05-18 – 2023-05-19 (×2): 3.125 mg via ORAL
  Filled 2023-05-18 (×2): qty 1

## 2023-05-18 NOTE — Progress Notes (Signed)
 Progress Note   [name] UUV:[id number] DOB: 02-Sep-1960 DOA: 05/16/2023     2 DOS: the patient was seen and examined on 05/18/2023   Brief hospital course: YIANNI Ramsey is a 63 y.o. male with medical history significant of former smoker, cocaine abuse, who presents with shortness of breath.  His BNP is low at 96, was tachycardic, tachypneic upon arrival, chest x-ray showed small layering left pleural effusion, major fissure fluid.  U tox positive for cocaine.  Patient is admitted to hospitalist service for further management evaluation of new CHF exacerbation.  Assessment and Plan: Acute systolic CHF: Unknown whether diastolic or systolic. Continue Lasix 20 mg daily. 2D echocardiogram showed EF 30% Cardiology follow up appreciated, He will be started on ACEI, beta blocker. Aldactone once kidney function better. He will need life vest, outpatient ischemic evaluation. Monitor daily renal function, electrolytes and replace accordingly. Monitor daily weights, strict input and output. Low-salt diet, fluid restriction advised. CHF instructions provided.  Type II MI in the setting of CHF. He denies any chest discomfort currently. EKG sinus rhythm.  No acute ST-T wave changes. A1c 5.8, LDL 68.  AKI: Kidney function worsening due to diuresis, dose changed to 20mg  daily, Aldactone once kidney function improves. Continue to monitor daily renal function. Avoid other nephrotoxic drugs.  Hypertension: Patient's BP consistently more than 130. Continue Coreg, Lisinopril. Continue IV hydralazine as needed.  Polysubstance abuse: Educated regarding ill effects of illicit drugs. Advised to quit smoking, stop using cocaine. He refuses nicotine patch at this time. Smoking cessation counseled.     Out of bed to chair. Incentive spirometry. Nursing supportive care. Fall, aspiration precautions. DVT prophylaxis   Code Status: Full Code  Subjective: Patient is seen and examined  today morning.  He feels better today. Did discuss his diagnosis and need to quit smoking, cocaine use.  Physical Exam: Vitals:   05/18/23 0545 05/18/23 0839 05/18/23 1851 05/18/23 2000  BP: 98/76 110/77 104/73 105/68  Pulse: 95 (!) 106 (!) 108 (!) 101  Resp: 14 16 16 18   Temp: 97.7 F (36.5 C) 97.9 F (36.6 C) 97.6 F (36.4 C) 98.4 F (36.9 C)  TempSrc: Oral Oral Oral Oral  SpO2: 99% 94% 95%   Weight:      Height:        General - Elderly Caucasian male, no apparent in distress. HEENT - PERRLA, EOMI, atraumatic head, non tender sinuses. Lung - Clear, basal rales, no rhonchi, wheezes. Heart - S1, S2 heard, no murmurs, rubs, trace pedal edema. Abdomen - Soft, non tender, bowel sounds good Neuro - Alert, awake and oriented x 3, non focal exam. Skin - Warm and dry.  Data Reviewed:      Latest Ref Rng & Units 05/18/2023    6:33 AM 05/16/2023    3:38 PM 08/19/2020    2:18 AM  CBC  WBC 4.0 - 10.5 K/uL 9.0  8.5  11.0   Hemoglobin 13.0 - 17.0 g/dL 47.4  25.9  56.3   Hematocrit 39.0 - 52.0 % 52.0  43.2  29.6   Platelets 150 - 400 K/uL 274  246  172       Latest Ref Rng & Units 05/18/2023    6:33 AM 05/17/2023    2:15 AM 05/16/2023    3:38 PM  BMP  Glucose 70 - 99 mg/dL 875  91  92   BUN 8 - 23 mg/dL 37  26  27   Creatinine 0.61 - 1.24  mg/dL 1.32  4.40  1.02   Sodium 135 - 145 mmol/L 136  137  139   Potassium 3.5 - 5.1 mmol/L 4.3  3.7  4.5   Chloride 98 - 111 mmol/L 103  106  110   CO2 22 - 32 mmol/L 23  22  24    Calcium 8.9 - 10.3 mg/dL 9.0  8.5  8.6    ECHOCARDIOGRAM COMPLETE Result Date: 05/17/2023    ECHOCARDIOGRAM REPORT   Patient Name:   Jeffrey Ramsey Date of Exam: 05/17/2023 Medical Rec #:  [id number]     Height:       69.0 in Accession #:    [id number]    Weight:       165.0 lb Date of Birth:  1961-01-03    BSA:          1.904 m Patient Age:    62 years      BP:           138/74 mmHg Patient Gender: M             HR:           97 bpm. Exam Location:  [facility] Procedure: 2D  Echo, Cardiac Doppler and Color Doppler (Both Spectral and Color            Flow Doppler were utilized during procedure). Indications:     CHF- Acute Systolic I50.21  History:         Patient has no prior history of Echocardiogram examinations.                  CHF; Risk Factors:Hypertension and Current Smoker.  Sonographer:     Lucendia Herrlich RCS Referring Phys:  [id number] Brien Few NIU Diagnosing Phys: Yvonne Kendall MD IMPRESSIONS  1. Left ventricular ejection fraction, by estimation, is 25 to 30%. The left ventricle has severely decreased function. The left ventricle demonstrates global hypokinesis. The left ventricular internal cavity size was mildly dilated. Left ventricular diastolic parameters are consistent with Grade II diastolic dysfunction (pseudonormalization).  2. Right ventricular systolic function is moderately reduced. The right ventricular size is mildly enlarged. There is normal pulmonary artery systolic pressure.  3. Left atrial size was mildly dilated.  4. Right atrial size was mildly dilated.  5. The mitral valve is normal in structure. Moderate to severe mitral valve regurgitation.  6. Tricuspid valve regurgitation is mild to moderate.  7. The aortic valve has an indeterminant number of cusps. Aortic valve regurgitation is moderate. No aortic stenosis is present.  8. The inferior vena cava is normal in size with greater than 50% respiratory variability, suggesting right atrial pressure of 3 mmHg. FINDINGS  Left Ventricle: Left ventricular ejection fraction, by estimation, is 25 to 30%. The left ventricle has severely decreased function. The left ventricle demonstrates global hypokinesis. The left ventricular internal cavity size was mildly dilated. There is no left ventricular hypertrophy. Left ventricular diastolic parameters are consistent with Grade II diastolic dysfunction (pseudonormalization). Right Ventricle: The right ventricular size is mildly enlarged. No increase in right ventricular wall  thickness. Right ventricular systolic function is moderately reduced. There is normal pulmonary artery systolic pressure. The tricuspid regurgitant velocity is 2.31 m/s, and with an assumed right atrial pressure of 3 mmHg, the estimated right ventricular systolic pressure is 24.3 mmHg. Left Atrium: Left atrial size was mildly dilated. Right Atrium: Right atrial size was mildly dilated. Pericardium: Trivial pericardial effusion is present. Mitral Valve: The mitral valve is normal in  structure. Moderate to severe mitral valve regurgitation. Tricuspid Valve: The tricuspid valve is grossly normal. Tricuspid valve regurgitation is mild to moderate. Aortic Valve: The aortic valve has an indeterminant number of cusps. Aortic valve regurgitation is moderate. Aortic regurgitation PHT measures 379 msec. No aortic stenosis is present. Aortic valve peak gradient measures 2.8 mmHg. Pulmonic Valve: The pulmonic valve was not well visualized. Pulmonic valve regurgitation is not visualized. No evidence of pulmonic stenosis. Aorta: The aortic root is normal in size and structure. Pulmonary Artery: The pulmonary artery is not well seen. Venous: The inferior vena cava is normal in size with greater than 50% respiratory variability, suggesting right atrial pressure of 3 mmHg. IAS/Shunts: No atrial level shunt detected by color flow Doppler.  LEFT VENTRICLE PLAX 2D LVIDd:         5.75 cm      Diastology LVIDs:         4.78 cm      LV e' medial:    7.51 cm/s LV PW:         0.73 cm      LV E/e' medial:  9.8 LV IVS:        0.64 cm      LV e' lateral:   10.50 cm/s LVOT diam:     2.00 cm      LV E/e' lateral: 7.0 LV SV:         46 LV SV Index:   24 LVOT Area:     3.14 cm  LV Volumes (MOD) LV vol d, MOD A2C: 140.0 ml LV vol d, MOD A4C: 142.0 ml LV vol s, MOD A2C: 79.4 ml LV vol s, MOD A4C: 92.0 ml LV SV MOD A2C:     60.6 ml LV SV MOD A4C:     142.0 ml LV SV MOD BP:      56.0 ml RIGHT VENTRICLE            IVC RV S prime:     7.74 cm/s  IVC  diam: 1.94 cm TAPSE (M-mode): 1.2 cm LEFT ATRIUM             Index        RIGHT ATRIUM           Index LA diam:        3.50 cm 1.84 cm/m   RA Area:     22.70 cm LA Vol (A2C):   73.8 ml 38.76 ml/m  RA Volume:   76.70 ml  40.29 ml/m LA Vol (A4C):   62.5 ml 32.83 ml/m LA Biplane Vol: 73.8 ml 38.76 ml/m  AORTIC VALVE AV Area (Vmax): 3.09 cm AV Vmax:        83.85 cm/s AV Peak Grad:   2.8 mmHg LVOT Vmax:      82.40 cm/s LVOT Vmean:     55.100 cm/s LVOT VTI:       0.145 m AI PHT:         379 msec  AORTA Ao Root diam: 3.60 cm MITRAL VALVE               TRICUSPID VALVE MV Area (PHT): 5.97 cm    TR Peak grad:   21.3 mmHg MV Decel Time: 127 msec    TR Vmax:        231.00 cm/s MR Peak grad: 81.7 mmHg MR Vmax:      452.00 cm/s  SHUNTS MV E velocity: 73.70 cm/s  Systemic VTI:  0.14 m  MV A velocity: 49.50 cm/s  Systemic Diam: 2.00 cm MV E/A ratio:  1.49 Yvonne Kendall MD Electronically signed by Yvonne Kendall MD Signature Date/Time: 05/17/2023/5:18:54 PM    Final     Family Communication: Discussed with patient, he understand and agree. All questions answered.  Disposition: Status is: Inpatient Remains inpatient appropriate because: new CHF on IV Lasix, cardiac work up.  Planned Discharge Destination: Home with Home Health     Time spent: 42 minutes. Time spent on smoking cessation counseling  Author: Marcelino Duster, MD 05/18/2023 9:34 PM Secure chat 7am to 7pm For on call review [email or web address].

## 2023-05-18 NOTE — Progress Notes (Signed)
 [facility]IC CARDIOLOGY PROGRESS NOTE       Patient ID: Jeffrey Ramsey MRN: [id number] DOB/AGE: 63-10-1960 63 y.o.  Admit date: 05/16/2023 Referring Physician Dr. Lorretta Harp Primary Physician Patient, No Pcp Per  Primary Cardiologist none  Reason for Consultation AoCHF  HPI: Jeffrey Ramsey is a 63 y.o. male  with a past medical history of cocaine use, tobacco use who presented to the ED on 05/16/2023 for shortness of breath. BNP elevated. Cardiology was consulted for further evaluation.   Interval history: -Patient seen and examined this morning, resting comfortably in hospital bed. -States that he is feeling much better overall now.  Denies any chest pain, shortness of breath. -States that he was able to sleep well last night and cannot lay flat.  He has no lower extremity edema. -We discussed his echo results in further detail.  Patient expressed understanding of his reduced heart function, as well as being in an increased risk of cardiac arrhythmia.  He also expressed understanding of the importance of cessation from drugs and alcohol.  Review of systems complete and found to be negative unless listed above    Past Medical History:  Diagnosis Date   Cocaine abuse (HCC)    Tobacco abuse     Past Surgical History:  Procedure Laterality Date   ABDOMINAL SURGERY     ORIF PATELLA Left 08/18/2020   Procedure: OPEN REDUCTION INTERNAL (ORIF) FIXATION PATELLA;  Surgeon: Roby Lofts, MD;  Location: [facility] OR;  Service: Orthopedics;  Laterality: Left;    Medications Prior to Admission  Medication Sig Dispense Refill Last Dose/Taking   acetaminophen (TYLENOL) 325 MG tablet Take 2 tablets (650 mg total) by mouth every 6 (six) hours as needed for mild pain. (Patient not taking: Reported on 05/16/2023)   Not Taking   methocarbamol (ROBAXIN) 750 MG tablet Take 1 tablet (750 mg total) by mouth every 8 (eight) hours as needed for muscle spasms. (Patient not taking: Reported on 05/16/2023) 30  tablet 0 Not Taking   Oxycodone HCl 10 MG TABS Take 0.5-1 tablets (5-10 mg total) by mouth every 6 (six) hours as needed for moderate pain or severe pain (5 mg moderate pain, 10 mg severe pain). (Patient not taking: Reported on 05/16/2023) 25 tablet 0 Not Taking   polyethylene glycol (MIRALAX / GLYCOLAX) 17 g packet Take 17 g by mouth daily as needed for mild constipation. (Patient not taking: Reported on 05/16/2023) 14 each 0 Not Taking   Social History   Socioeconomic History   Marital status: Single    Spouse name: Not on file   Number of children: Not on file   Years of education: Not on file   Highest education level: Not on file  Occupational History   Not on file  Tobacco Use   Smoking status: Former    Current packs/day: 1.00    Types: Cigarettes   Smokeless tobacco: Never  Vaping Use   Vaping status: Never Used  Substance and Sexual Activity   Alcohol use: No   Drug use: Yes    Types: Marijuana, Codeine   Sexual activity: Not on file  Other Topics Concern   Not on file  Social History Narrative   Not on file   Social Drivers of Health   Financial Resource Strain: Not on file  Food Insecurity: No Food Insecurity (05/17/2023)   Hunger Vital Sign    Worried About Running Out of Food in the Last Year: Never true    Ran  Out of Food in the Last Year: Never true  Transportation Needs: No Transportation Needs (05/17/2023)   PRAPARE - Administrator, Civil Service (Medical): No    Lack of Transportation (Non-Medical): No  Physical Activity: Not on file  Stress: Not on file  Social Connections: Not on file  Intimate Partner Violence: Not At Risk (05/17/2023)   Humiliation, Afraid, Rape, and Kick questionnaire    Fear of Current or Ex-Partner: No    Emotionally Abused: No    Physically Abused: No    Sexually Abused: No    Family History  Problem Relation Age of Onset   Heart disease Mother    Heart failure Brother      Vitals:   05/17/23 2156 05/18/23  0157 05/18/23 0545 05/18/23 0839  BP: 105/81 103/71 98/76 110/77  Pulse: 100 97 95 (!) 106  Resp: 18 11 14 16   Temp: 97.8 F (36.6 C) 97.8 F (36.6 C) 97.7 F (36.5 C) 97.9 F (36.6 C)  TempSrc: Oral Oral Oral Oral  SpO2: 97% 99% 99% 94%  Weight:      Height:        PHYSICAL EXAM General: Well appearing male, well nourished, in no acute distress. HEENT: Normocephalic and atraumatic. Neck: No JVD.  Lungs: Normal respiratory effort on room air.  CTAB. Heart: HRRR. Normal S1 and S2 without gallops or murmurs.  Abdomen: Non-distended appearing.  Msk: Normal strength and tone for age. Extremities: Warm and well perfused. No clubbing, cyanosis.  No edema.  Neuro: Alert and oriented X 3. Psych: Answers questions appropriately.   Labs: Basic Metabolic Panel: Recent Labs    05/16/23 2057 05/17/23 0215 05/18/23 0633  NA  --  137 136  K  --  3.7 4.3  CL  --  106 103  CO2  --  22 23  GLUCOSE  --  91 121*  BUN  --  26* 37*  CREATININE  --  1.27* 1.53*  CALCIUM  --  8.5* 9.0  MG 2.1  --  2.2   Liver Function Tests: Recent Labs    05/16/23 1538  AST 29  ALT 69*  ALKPHOS 101  BILITOT 0.7  PROT 6.6  ALBUMIN 3.6   Recent Labs    05/16/23 1538  LIPASE 28   CBC: Recent Labs    05/16/23 1538 05/18/23 0633  WBC 8.5 9.0  NEUTROABS 6.2  --   HGB 14.2 17.9*  HCT 43.2 52.0  MCV 92.7 87.1  PLT 246 274   Cardiac Enzymes: Recent Labs    05/16/23 2056 05/17/23 0215 05/17/23 0422  TROPONINIHS 33* 40* 39*   BNP: Recent Labs    05/16/23 1538  BNP 1,196.1*   D-Dimer: Recent Labs    05/17/23 0215  DDIMER 0.74*   Hemoglobin A1C: Recent Labs    05/16/23 2057  HGBA1C 5.8*   Fasting Lipid Panel: Recent Labs    05/17/23 0215  CHOL 118  HDL 37*  LDLCALC 68  TRIG 66  CHOLHDL 3.2   Thyroid Function Tests: No results for input(s): "TSH", "T4TOTAL", "T3FREE", "THYROIDAB" in the last 72 hours.  Invalid input(s): "FREET3" Anemia Panel: No results for  input(s): "VITAMINB12", "FOLATE", "FERRITIN", "TIBC", "IRON", "RETICCTPCT" in the last 72 hours.   Radiology: ECHOCARDIOGRAM COMPLETE Result Date: 05/17/2023    ECHOCARDIOGRAM REPORT   Patient Name:   Jeffrey Ramsey Date of Exam: 05/17/2023 Medical Rec #:  [id number]     Height:  69.0 in Accession #:    [id number]    Weight:       165.0 lb Date of Birth:  1960/10/01    BSA:          1.904 m Patient Age:    62 years      BP:           138/74 mmHg Patient Gender: M             HR:           97 bpm. Exam Location:  [facility] Procedure: 2D Echo, Cardiac Doppler and Color Doppler (Both Spectral and Color            Flow Doppler were utilized during procedure). Indications:     CHF- Acute Systolic I50.21  History:         Patient has no prior history of Echocardiogram examinations.                  CHF; Risk Factors:Hypertension and Current Smoker.  Sonographer:     Lucendia Herrlich RCS Referring Phys:  [id number] Brien Few NIU Diagnosing Phys: Yvonne Kendall MD IMPRESSIONS  1. Left ventricular ejection fraction, by estimation, is 25 to 30%. The left ventricle has severely decreased function. The left ventricle demonstrates global hypokinesis. The left ventricular internal cavity size was mildly dilated. Left ventricular diastolic parameters are consistent with Grade II diastolic dysfunction (pseudonormalization).  2. Right ventricular systolic function is moderately reduced. The right ventricular size is mildly enlarged. There is normal pulmonary artery systolic pressure.  3. Left atrial size was mildly dilated.  4. Right atrial size was mildly dilated.  5. The mitral valve is normal in structure. Moderate to severe mitral valve regurgitation.  6. Tricuspid valve regurgitation is mild to moderate.  7. The aortic valve has an indeterminant number of cusps. Aortic valve regurgitation is moderate. No aortic stenosis is present.  8. The inferior vena cava is normal in size with greater than 50% respiratory variability, suggesting  right atrial pressure of 3 mmHg. FINDINGS  Left Ventricle: Left ventricular ejection fraction, by estimation, is 25 to 30%. The left ventricle has severely decreased function. The left ventricle demonstrates global hypokinesis. The left ventricular internal cavity size was mildly dilated. There is no left ventricular hypertrophy. Left ventricular diastolic parameters are consistent with Grade II diastolic dysfunction (pseudonormalization). Right Ventricle: The right ventricular size is mildly enlarged. No increase in right ventricular wall thickness. Right ventricular systolic function is moderately reduced. There is normal pulmonary artery systolic pressure. The tricuspid regurgitant velocity is 2.31 m/s, and with an assumed right atrial pressure of 3 mmHg, the estimated right ventricular systolic pressure is 24.3 mmHg. Left Atrium: Left atrial size was mildly dilated. Right Atrium: Right atrial size was mildly dilated. Pericardium: Trivial pericardial effusion is present. Mitral Valve: The mitral valve is normal in structure. Moderate to severe mitral valve regurgitation. Tricuspid Valve: The tricuspid valve is grossly normal. Tricuspid valve regurgitation is mild to moderate. Aortic Valve: The aortic valve has an indeterminant number of cusps. Aortic valve regurgitation is moderate. Aortic regurgitation PHT measures 379 msec. No aortic stenosis is present. Aortic valve peak gradient measures 2.8 mmHg. Pulmonic Valve: The pulmonic valve was not well visualized. Pulmonic valve regurgitation is not visualized. No evidence of pulmonic stenosis. Aorta: The aortic root is normal in size and structure. Pulmonary Artery: The pulmonary artery is not well seen. Venous: The inferior vena cava is normal in size with greater than 50% respiratory  variability, suggesting right atrial pressure of 3 mmHg. IAS/Shunts: No atrial level shunt detected by color flow Doppler.  LEFT VENTRICLE PLAX 2D LVIDd:         5.75 cm       Diastology LVIDs:         4.78 cm      LV e' medial:    7.51 cm/s LV PW:         0.73 cm      LV E/e' medial:  9.8 LV IVS:        0.64 cm      LV e' lateral:   10.50 cm/s LVOT diam:     2.00 cm      LV E/e' lateral: 7.0 LV SV:         46 LV SV Index:   24 LVOT Area:     3.14 cm  LV Volumes (MOD) LV vol d, MOD A2C: 140.0 ml LV vol d, MOD A4C: 142.0 ml LV vol s, MOD A2C: 79.4 ml LV vol s, MOD A4C: 92.0 ml LV SV MOD A2C:     60.6 ml LV SV MOD A4C:     142.0 ml LV SV MOD BP:      56.0 ml RIGHT VENTRICLE            IVC RV S prime:     7.74 cm/s  IVC diam: 1.94 cm TAPSE (M-mode): 1.2 cm LEFT ATRIUM             Index        RIGHT ATRIUM           Index LA diam:        3.50 cm 1.84 cm/m   RA Area:     22.70 cm LA Vol (A2C):   73.8 ml 38.76 ml/m  RA Volume:   76.70 ml  40.29 ml/m LA Vol (A4C):   62.5 ml 32.83 ml/m LA Biplane Vol: 73.8 ml 38.76 ml/m  AORTIC VALVE AV Area (Vmax): 3.09 cm AV Vmax:        83.85 cm/s AV Peak Grad:   2.8 mmHg LVOT Vmax:      82.40 cm/s LVOT Vmean:     55.100 cm/s LVOT VTI:       0.145 m AI PHT:         379 msec  AORTA Ao Root diam: 3.60 cm MITRAL VALVE               TRICUSPID VALVE MV Area (PHT): 5.97 cm    TR Peak grad:   21.3 mmHg MV Decel Time: 127 msec    TR Vmax:        231.00 cm/s MR Peak grad: 81.7 mmHg MR Vmax:      452.00 cm/s  SHUNTS MV E velocity: 73.70 cm/s  Systemic VTI:  0.14 m MV A velocity: 49.50 cm/s  Systemic Diam: 2.00 cm MV E/A ratio:  1.49 Cristal Deer End MD Electronically signed by Yvonne Kendall MD Signature Date/Time: 05/17/2023/5:18:54 PM    Final    DG Chest 2 View Result Date: 05/16/2023 CLINICAL DATA:  Shortness of breath EXAM: CHEST - 2 VIEW COMPARISON:  Chest x-ray 08/14/2020 FINDINGS: There is peribronchial wall thickening in the hilar regions bilaterally. There is new dense focal opacity along the major fissure seen on the lateral view, possibly loculated fluid. The cardiomediastinal silhouette is within normal limits. There is a small layering left  pleural effusion. There is no pneumothorax or acute  fracture. IMPRESSION: 1. New dense focal opacity along the major fissure seen on the lateral view, possibly loculated fluid. Recommend further evaluation with CT. 2. Small layering left pleural effusion. 3. Peribronchial wall thickening in the hilar regions bilaterally, likely infectious/inflammatory. Electronically Signed   By: Darliss Cheney M.D.   On: 05/16/2023 18:45    ECHO as above  TELEMETRY reviewed by me 05/18/2023: sinus tach 100s  EKG reviewed by me: sinus tachycardia rate 111 bpm  Data reviewed by me 05/18/2023: last 24h vitals tele labs imaging I/O hospitalist progress note  Principal Problem:   Acute CHF (HCC) Active Problems:   Tobacco abuse   Cocaine abuse (HCC)   Myocardial injury   AKI (acute kidney injury) (HCC)   HTN (hypertension)    ASSESSMENT AND PLAN:  Jeffrey Ramsey is a 63 y.o. male  with a past medical history of cocaine use, tobacco use who presented to the ED on 05/16/2023 for shortness of breath. BNP elevated. Cardiology was consulted for further evaluation.   # Acute congestive heart failure # Cocaine use # Demand ischemia Patient presented with worsening shortness of breath for the last few weeks with associated orthopnea.  BNP found to be elevated at nearly 1200.  Started on IV diuresis in the ED. echo this admission with EF 25-30%, global hypokinesis, grade 2 diastolic dysfunction, RV function moderately reduced with normal PASP, moderate AR. -Will transition to p.o. Lasix 20 mg daily. -Discontinue amlodipine, start carvedilol 3.125 mg twice daily, lisinopril 5 mg daily. -Mildly elevated and flat trending troponin most consistent with demand/supply mismatch and not ACS in the setting of AoCHF and cocaine use. -No plan for further cardiac diagnostics at this time.  Will likely plan for ischemic workup outpatient. -Patient advised of risk associated with cocaine use and cessation was strongly advised.  He  expressed understanding of risk associated with cocaine use. -Will set the patient up with LifeVest.  Patient educated on the need for this.  Can consider discharge later today pending LifeVest placement.  Will set patient up for follow-up in my office in 1 to 2 weeks.  This patient's plan of care was discussed and created with Dr. Melton Alar and she is in agreement.  Signed: Gale Journey, PA-C  05/18/2023, 11:12 AM [facility]

## 2023-05-18 NOTE — Progress Notes (Signed)
 Heart Failure Stewardship Pharmacy Note  PCP: Patient, No Pcp Per PCP-Cardiologist: None  HPI: Jeffrey Ramsey is a 63 y.o. male with hypertension cocaine use who presented with shortness of breath for the past 2 months, worsening several days prior to admission.  On admission, BNP was 1196.1, HS-troponin was 28 > 33 > 40, and UDS was positive for cocaine. Chest x-ray noted small layering pleural effusion and peribronchial wall thickening likely infectious/inflammatory. Echocardiogram this admission noted LVEF to be 25-30% with grade II diastolic dysfunction, moderately reduced RV function, mildly dilated atria, moderate to severe MR, moderate AR.  Pertinent Lab Values: Creatinine, Ser  Date Value Ref Range Status  05/18/2023 1.53 (H) 0.61 - 1.24 mg/dL Final   BUN  Date Value Ref Range Status  05/18/2023 37 (H) 8 - 23 mg/dL Final   Potassium  Date Value Ref Range Status  05/18/2023 4.3 3.5 - 5.1 mmol/L Final   Sodium  Date Value Ref Range Status  05/18/2023 136 135 - 145 mmol/L Final   B Natriuretic Peptide  Date Value Ref Range Status  05/16/2023 1,196.1 (H) 0.0 - 100.0 pg/mL Final    Comment:    Performed at [facility], [address] [place], [place] [address]   Magnesium  Date Value Ref Range Status  05/18/2023 2.2 1.7 - 2.4 mg/dL Final    Comment:    Performed at [facility], [address] [place], [place] [address]   Hgb A1c MFr Bld  Date Value Ref Range Status  05/16/2023 5.8 (H) 4.8 - 5.6 % Final    Comment:    (NOTE) Pre diabetes:          5.7%-6.4%  Diabetes:              >6.4%  Glycemic control for   <7.0% adults with diabetes     Vital Signs:  Temp:  [97.7 F (36.5 C)-98.6 F (37 C)] 97.9 F (36.6 C) (03/12 0839) Pulse Rate:  [95-106] 106 (03/12 0839) Cardiac Rhythm: Sinus tachycardia (03/12 0700) Resp:  [11-19] 16 (03/12 0839) BP: (98-132)/(71-83) 110/77 (03/12 0839) SpO2:  [94 %-99 %] 94 % (03/12 0839)  Intake/Output  Summary (Last 24 hours) at 05/18/2023 1329 Last data filed at 05/18/2023 0800 Gross per 24 hour  Intake 480 ml  Output 550 ml  Net -70 ml    Current Heart Failure Medications:  Loop diuretic: furosemide 20 mg daily Beta-Blocker: carvedilol 3.125 mg BID ACEI/ARB/ARNI: lisinopril 5 mg daily MRA: none SGLT2i: none Other: none  Prior to admission Heart Failure Medications:  Loop diuretic: none Beta-Blocker: none ACEI/ARB/ARNI: none MRA: none SGLT2i: none Other: none  Assessment: 1. Acute systolic heart failure (LVEF 25-30%) with grade II diastolic dysfunction, due to presumed NICM. NYHA class II symptoms.  -Symptoms: Reports feeling back to baseline. -Volume: Appears euvolemic. Agree with transition from IV furosemide to oral 20 mg daily.  -Hemodynamics: BP is stable at the lower limit of normal. HR 90s. -BB: Continue low dose carvedilol 3.125 mg BID. -ACEI/ARB/ARNI: Continue lisinopril 5 mg daily. -MRA: Would consider adding spironolactone 12.5 mg daily tomorrow if creatinine & K are stable. Otherwise, consider outpatient. -SGLT2i: Would require patient assistance for new start SGLT2i.   Plan: 1) Medication changes recommended at this time: -Agree with changes. Can consider spironolactone 12.5 mg daily tomorrow only if creatinine improves.  2) Patient assistance: -Patient is uninsured. Would likely qualify for patient assistance if required for SGLT2i. Please reach out if decision is made  to start while inpatient.  3) Education: - Patient has been educated on current HF medications and potential additions to HF medication regimen - Patient verbalizes understanding that over the next few months, these medication doses may change and more medications may be added to optimize HF regimen - Patient has been educated on basic disease state pathophysiology and goals of therapy  Medication Assistance / Insurance Benefits Check: Does the patient have prescription insurance?    Type  of insurance plan:  Does the patient qualify for medication assistance through manufacturers or grants? Pending  Eligible grants and/or patient assistance programs: pending  Medication assistance applications in progress: none  Medication assistance applications approved: none Approved medication assistance renewals will be completed by: [facility]  Outpatient Pharmacy: Prior to admission outpatient pharmacy: none   Is the patient willing to utilize a [facility] pharmacy at discharge?: Yes  Please do not hesitate to reach out with questions or concerns,  Enos Fling, PharmD, CPP, BCPS Heart Failure Pharmacist  Phone - [phone] 05/18/2023 1:29 PM

## 2023-05-18 NOTE — TOC Progression Note (Signed)
 Transition of Care [facility]) - Progression Note    Patient Details  Name: Jeffrey Ramsey MRN: [id number] Date of Birth: [birth year]  [facility] CM/SW Contact  Truddie Hidden, RN Phone Number: 05/18/2023, 2:05 PM  Clinical Narrative:    Sherron Monday with Theodoro Grist from [employer] life vest.  Requested documents for life vest sent to [email or web address]        Expected Discharge Plan and Services                                               Social Determinants of Health (SDOH) Interventions SDOH Screenings   Food Insecurity: No Food Insecurity (05/17/2023)  Housing: High Risk (05/17/2023)  Transportation Needs: No Transportation Needs (05/17/2023)  Utilities: Not At Risk (05/17/2023)  Tobacco Use: Medium Risk (05/16/2023)    Readmission Risk Interventions     No data to display

## 2023-05-19 ENCOUNTER — Other Ambulatory Visit: Payer: Self-pay

## 2023-05-19 LAB — BASIC METABOLIC PANEL
Anion gap: 8 (ref 5–15)
BUN: 43 mg/dL — ABNORMAL HIGH (ref 8–23)
CO2: 23 mmol/L (ref 22–32)
Calcium: 8.6 mg/dL — ABNORMAL LOW (ref 8.9–10.3)
Chloride: 104 mmol/L (ref 98–111)
Creatinine, Ser: 1.59 mg/dL — ABNORMAL HIGH (ref 0.61–1.24)
GFR, Estimated: 49 mL/min — ABNORMAL LOW (ref >60)
Glucose, Bld: 108 mg/dL — ABNORMAL HIGH (ref 70–99)
Potassium: 4.6 mmol/L (ref 3.5–5.1)
Sodium: 135 mmol/L (ref 135–145)

## 2023-05-19 LAB — CBC
HCT: 49.8 % (ref 39.0–52.0)
Hemoglobin: 17 g/dL (ref 13.0–17.0)
MCH: 29.8 pg (ref 26.0–34.0)
MCHC: 34.1 g/dL (ref 30.0–36.0)
MCV: 87.4 fL (ref 80.0–100.0)
Platelets: 268 10*3/uL (ref 150–400)
RBC: 5.7 MIL/uL (ref 4.22–5.81)
RDW: 13.3 % (ref 11.5–15.5)
WBC: 7.3 10*3/uL (ref 4.0–10.5)
nRBC: 0 % (ref 0.0–0.2)

## 2023-05-19 MED ORDER — CARVEDILOL 3.125 MG PO TABS
3.1250 mg | ORAL_TABLET | Freq: Two times a day (BID) | ORAL | 0 refills | Status: AC
  Filled 2023-05-19: qty 180, 90d supply, fill #0

## 2023-05-19 MED ORDER — POTASSIUM CHLORIDE CRYS ER 20 MEQ PO TBCR: 40.0000 meq | EXTENDED_RELEASE_TABLET | Freq: Every day | ORAL | Status: DC

## 2023-05-19 MED ORDER — FUROSEMIDE 20 MG PO TABS
20.0000 mg | ORAL_TABLET | Freq: Every day | ORAL | 0 refills | Status: AC
  Filled 2023-05-19: qty 90, 90d supply, fill #0

## 2023-05-19 MED ORDER — LISINOPRIL 5 MG PO TABS
5.0000 mg | ORAL_TABLET | Freq: Every day | ORAL | 0 refills | Status: AC
  Filled 2023-05-19: qty 90, 90d supply, fill #0

## 2023-05-19 MED ORDER — ASPIRIN 81 MG PO CHEW
81.0000 mg | CHEWABLE_TABLET | Freq: Every day | ORAL | 0 refills | Status: AC
  Filled 2023-05-19: qty 90, 90d supply, fill #0

## 2023-05-19 NOTE — Progress Notes (Signed)
 [facility] CLINIC CARDIOLOGY PROGRESS NOTE       Patient ID: EIAN VANDERVELDEN MRN: [id number] DOB/AGE: 10-14-1960 63 y.o.  Admit date: 05/16/2023 Referring Physician Dr. Lorretta Harp Primary Physician Patient, No Pcp Per  Primary Cardiologist none  Reason for Consultation AoCHF  HPI: DILLIN LOFGREN is a 63 y.o. male  with a past medical history of cocaine use, tobacco use who presented to the ED on 05/16/2023 for shortness of breath. BNP elevated. Cardiology was consulted for further evaluation.   Interval history: -Patient seen and examined this morning, resting comfortably in hospital bed. -Continues to feel well without any shortness of breath or chest pain. -LifeVest should be placed today.  Patient is eager to be discharged.  Review of systems complete and found to be negative unless listed above    Past Medical History:  Diagnosis Date   Cocaine abuse (HCC)    Tobacco abuse     Past Surgical History:  Procedure Laterality Date   ABDOMINAL SURGERY     ORIF PATELLA Left 08/18/2020   Procedure: OPEN REDUCTION INTERNAL (ORIF) FIXATION PATELLA;  Surgeon: Roby Lofts, MD;  Location: [facility] OR;  Service: Orthopedics;  Laterality: Left;    Medications Prior to Admission  Medication Sig Dispense Refill Last Dose/Taking   acetaminophen (TYLENOL) 325 MG tablet Take 2 tablets (650 mg total) by mouth every 6 (six) hours as needed for mild pain. (Patient not taking: Reported on 05/16/2023)   Not Taking   methocarbamol (ROBAXIN) 750 MG tablet Take 1 tablet (750 mg total) by mouth every 8 (eight) hours as needed for muscle spasms. (Patient not taking: Reported on 05/16/2023) 30 tablet 0 Not Taking   Oxycodone HCl 10 MG TABS Take 0.5-1 tablets (5-10 mg total) by mouth every 6 (six) hours as needed for moderate pain or severe pain (5 mg moderate pain, 10 mg severe pain). (Patient not taking: Reported on 05/16/2023) 25 tablet 0 Not Taking   polyethylene glycol (MIRALAX / GLYCOLAX) 17 g packet Take 17 g  by mouth daily as needed for mild constipation. (Patient not taking: Reported on 05/16/2023) 14 each 0 Not Taking   Social History   Socioeconomic History   Marital status: Single    Spouse name: Not on file   Number of children: Not on file   Years of education: Not on file   Highest education level: Not on file  Occupational History   Not on file  Tobacco Use   Smoking status: Former    Current packs/day: 1.00    Types: Cigarettes   Smokeless tobacco: Never  Vaping Use   Vaping status: Never Used  Substance and Sexual Activity   Alcohol use: No   Drug use: Yes    Types: Marijuana, Codeine   Sexual activity: Not on file  Other Topics Concern   Not on file  Social History Narrative   Not on file   Social Drivers of Health   Financial Resource Strain: Not on file  Food Insecurity: No Food Insecurity (05/17/2023)   Hunger Vital Sign    Worried About Running Out of Food in the Last Year: Never true    Ran Out of Food in the Last Year: Never true  Transportation Needs: No Transportation Needs (05/17/2023)   PRAPARE - Administrator, Civil Service (Medical): No    Lack of Transportation (Non-Medical): No  Physical Activity: Not on file  Stress: Not on file  Social Connections: Not on file  Intimate  Partner Violence: Not At Risk (05/17/2023)   Humiliation, Afraid, Rape, and Kick questionnaire    Fear of Current or Ex-Partner: No    Emotionally Abused: No    Physically Abused: No    Sexually Abused: No    Family History  Problem Relation Age of Onset   Heart disease Mother    Heart failure Brother      Vitals:   05/18/23 2334 05/19/23 0432 05/19/23 0500 05/19/23 0840  BP: 101/75 91/72  106/79  Pulse: 93 88  88  Resp: 18 18  16   Temp:  98.3 F (36.8 C)  97.8 F (36.6 C)  TempSrc:  Oral  Oral  SpO2: 94% 97%  97%  Weight:   78.8 kg 65.9 kg  Height:        PHYSICAL EXAM General: Well appearing male, well nourished, in no acute distress. HEENT:  Normocephalic and atraumatic. Neck: No JVD.  Lungs: Normal respiratory effort on room air.  CTAB. Heart: HRRR. Normal S1 and S2 without gallops or murmurs.  Abdomen: Non-distended appearing.  Msk: Normal strength and tone for age. Extremities: Warm and well perfused. No clubbing, cyanosis.  No edema.  Neuro: Alert and oriented X 3. Psych: Answers questions appropriately.   Labs: Basic Metabolic Panel: Recent Labs    05/16/23 2057 05/17/23 0215 05/18/23 0633 05/19/23 0544  NA  --    < > 136 135  K  --    < > 4.3 4.6  CL  --    < > 103 104  CO2  --    < > 23 23  GLUCOSE  --    < > 121* 108*  BUN  --    < > 37* 43*  CREATININE  --    < > 1.53* 1.59*  CALCIUM  --    < > 9.0 8.6*  MG 2.1  --  2.2  --    < > = values in this interval not displayed.   Liver Function Tests: Recent Labs    05/16/23 1538  AST 29  ALT 69*  ALKPHOS 101  BILITOT 0.7  PROT 6.6  ALBUMIN 3.6   Recent Labs    05/16/23 1538  LIPASE 28   CBC: Recent Labs    05/16/23 1538 05/18/23 0633 05/19/23 0544  WBC 8.5 9.0 7.3  NEUTROABS 6.2  --   --   HGB 14.2 17.9* 17.0  HCT 43.2 52.0 49.8  MCV 92.7 87.1 87.4  PLT 246 274 268   Cardiac Enzymes: Recent Labs    05/16/23 2056 05/17/23 0215 05/17/23 0422  TROPONINIHS 33* 40* 39*   BNP: Recent Labs    05/16/23 1538  BNP 1,196.1*   D-Dimer: Recent Labs    05/17/23 0215  DDIMER 0.74*   Hemoglobin A1C: Recent Labs    05/16/23 2057  HGBA1C 5.8*   Fasting Lipid Panel: Recent Labs    05/17/23 0215  CHOL 118  HDL 37*  LDLCALC 68  TRIG 66  CHOLHDL 3.2   Thyroid Function Tests: No results for input(s): "TSH", "T4TOTAL", "T3FREE", "THYROIDAB" in the last 72 hours.  Invalid input(s): "FREET3" Anemia Panel: No results for input(s): "VITAMINB12", "FOLATE", "FERRITIN", "TIBC", "IRON", "RETICCTPCT" in the last 72 hours.   Radiology: ECHOCARDIOGRAM COMPLETE Result Date: 05/17/2023    ECHOCARDIOGRAM REPORT   Patient Name:   PRIMUS GRITTON Date of Exam: 05/17/2023 Medical Rec #:  [id number]     Height:       69.0  in Accession #:    [id number]    Weight:       165.0 lb Date of Birth:  Jun 15, 1960    BSA:          1.904 m Patient Age:    62 years      BP:           138/74 mmHg Patient Gender: M             HR:           97 bpm. Exam Location:  [facility] Procedure: 2D Echo, Cardiac Doppler and Color Doppler (Both Spectral and Color            Flow Doppler were utilized during procedure). Indications:     CHF- Acute Systolic I50.21  History:         Patient has no prior history of Echocardiogram examinations.                  CHF; Risk Factors:Hypertension and Current Smoker.  Sonographer:     Lucendia Herrlich RCS Referring Phys:  [id number] Brien Few NIU Diagnosing Phys: Yvonne Kendall MD IMPRESSIONS  1. Left ventricular ejection fraction, by estimation, is 25 to 30%. The left ventricle has severely decreased function. The left ventricle demonstrates global hypokinesis. The left ventricular internal cavity size was mildly dilated. Left ventricular diastolic parameters are consistent with Grade II diastolic dysfunction (pseudonormalization).  2. Right ventricular systolic function is moderately reduced. The right ventricular size is mildly enlarged. There is normal pulmonary artery systolic pressure.  3. Left atrial size was mildly dilated.  4. Right atrial size was mildly dilated.  5. The mitral valve is normal in structure. Moderate to severe mitral valve regurgitation.  6. Tricuspid valve regurgitation is mild to moderate.  7. The aortic valve has an indeterminant number of cusps. Aortic valve regurgitation is moderate. No aortic stenosis is present.  8. The inferior vena cava is normal in size with greater than 50% respiratory variability, suggesting right atrial pressure of 3 mmHg. FINDINGS  Left Ventricle: Left ventricular ejection fraction, by estimation, is 25 to 30%. The left ventricle has severely decreased function. The left ventricle demonstrates global  hypokinesis. The left ventricular internal cavity size was mildly dilated. There is no left ventricular hypertrophy. Left ventricular diastolic parameters are consistent with Grade II diastolic dysfunction (pseudonormalization). Right Ventricle: The right ventricular size is mildly enlarged. No increase in right ventricular wall thickness. Right ventricular systolic function is moderately reduced. There is normal pulmonary artery systolic pressure. The tricuspid regurgitant velocity is 2.31 m/s, and with an assumed right atrial pressure of 3 mmHg, the estimated right ventricular systolic pressure is 24.3 mmHg. Left Atrium: Left atrial size was mildly dilated. Right Atrium: Right atrial size was mildly dilated. Pericardium: Trivial pericardial effusion is present. Mitral Valve: The mitral valve is normal in structure. Moderate to severe mitral valve regurgitation. Tricuspid Valve: The tricuspid valve is grossly normal. Tricuspid valve regurgitation is mild to moderate. Aortic Valve: The aortic valve has an indeterminant number of cusps. Aortic valve regurgitation is moderate. Aortic regurgitation PHT measures 379 msec. No aortic stenosis is present. Aortic valve peak gradient measures 2.8 mmHg. Pulmonic Valve: The pulmonic valve was not well visualized. Pulmonic valve regurgitation is not visualized. No evidence of pulmonic stenosis. Aorta: The aortic root is normal in size and structure. Pulmonary Artery: The pulmonary artery is not well seen. Venous: The inferior vena cava is normal in size with greater than 50% respiratory variability,  suggesting right atrial pressure of 3 mmHg. IAS/Shunts: No atrial level shunt detected by color flow Doppler.  LEFT VENTRICLE PLAX 2D LVIDd:         5.75 cm      Diastology LVIDs:         4.78 cm      LV e' medial:    7.51 cm/s LV PW:         0.73 cm      LV E/e' medial:  9.8 LV IVS:        0.64 cm      LV e' lateral:   10.50 cm/s LVOT diam:     2.00 cm      LV E/e' lateral: 7.0 LV  SV:         46 LV SV Index:   24 LVOT Area:     3.14 cm  LV Volumes (MOD) LV vol d, MOD A2C: 140.0 ml LV vol d, MOD A4C: 142.0 ml LV vol s, MOD A2C: 79.4 ml LV vol s, MOD A4C: 92.0 ml LV SV MOD A2C:     60.6 ml LV SV MOD A4C:     142.0 ml LV SV MOD BP:      56.0 ml RIGHT VENTRICLE            IVC RV S prime:     7.74 cm/s  IVC diam: 1.94 cm TAPSE (M-mode): 1.2 cm LEFT ATRIUM             Index        RIGHT ATRIUM           Index LA diam:        3.50 cm 1.84 cm/m   RA Area:     22.70 cm LA Vol (A2C):   73.8 ml 38.76 ml/m  RA Volume:   76.70 ml  40.29 ml/m LA Vol (A4C):   62.5 ml 32.83 ml/m LA Biplane Vol: 73.8 ml 38.76 ml/m  AORTIC VALVE AV Area (Vmax): 3.09 cm AV Vmax:        83.85 cm/s AV Peak Grad:   2.8 mmHg LVOT Vmax:      82.40 cm/s LVOT Vmean:     55.100 cm/s LVOT VTI:       0.145 m AI PHT:         379 msec  AORTA Ao Root diam: 3.60 cm MITRAL VALVE               TRICUSPID VALVE MV Area (PHT): 5.97 cm    TR Peak grad:   21.3 mmHg MV Decel Time: 127 msec    TR Vmax:        231.00 cm/s MR Peak grad: 81.7 mmHg MR Vmax:      452.00 cm/s  SHUNTS MV E velocity: 73.70 cm/s  Systemic VTI:  0.14 m MV A velocity: 49.50 cm/s  Systemic Diam: 2.00 cm MV E/A ratio:  1.49 Cristal Deer End MD Electronically signed by Yvonne Kendall MD Signature Date/Time: 05/17/2023/5:18:54 PM    Final    DG Chest 2 View Result Date: 05/16/2023 CLINICAL DATA:  Shortness of breath EXAM: CHEST - 2 VIEW COMPARISON:  Chest x-ray 08/14/2020 FINDINGS: There is peribronchial wall thickening in the hilar regions bilaterally. There is new dense focal opacity along the major fissure seen on the lateral view, possibly loculated fluid. The cardiomediastinal silhouette is within normal limits. There is a small layering left pleural effusion. There is no pneumothorax or acute fracture. IMPRESSION:  1. New dense focal opacity along the major fissure seen on the lateral view, possibly loculated fluid. Recommend further evaluation with CT. 2. Small  layering left pleural effusion. 3. Peribronchial wall thickening in the hilar regions bilaterally, likely infectious/inflammatory. Electronically Signed   By: Darliss Cheney M.D.   On: 05/16/2023 18:45    ECHO as above  TELEMETRY reviewed by me 05/19/2023: sinus rhythm rate 90s  EKG reviewed by me: sinus tachycardia rate 111 bpm  Data reviewed by me 05/19/2023: last 24h vitals tele labs imaging I/O hospitalist progress note  Principal Problem:   Acute systolic CHF (congestive heart failure) (HCC) Active Problems:   Acute CHF (HCC)   Tobacco abuse   Cocaine abuse (HCC)   Myocardial injury   AKI (acute kidney injury) (HCC)   HTN (hypertension)    ASSESSMENT AND PLAN:  GEROGE GILLIAM is a 63 y.o. male  with a past medical history of cocaine use, tobacco use who presented to the ED on 05/16/2023 for shortness of breath. BNP elevated. Cardiology was consulted for further evaluation.   # Acute congestive heart failure # Cocaine use # Demand ischemia Patient presented with worsening shortness of breath for the last few weeks with associated orthopnea.  BNP found to be elevated at nearly 1200.  Started on IV diuresis in the ED. echo this admission with EF 25-30%, global hypokinesis, grade 2 diastolic dysfunction, RV function moderately reduced with normal PASP, moderate AR. -Continue p.o. Lasix 20 mg daily. -Continue carvedilol 3.125 mg twice daily, lisinopril 5 mg daily.  Further additions to GDMT limited by BP and renal function. -Mildly elevated and flat trending troponin most consistent with demand/supply mismatch and not ACS in the setting of AoCHF and cocaine use. -No plan for further cardiac diagnostics at this time.  Will likely plan for ischemic workup outpatient. -Patient advised of risk associated with cocaine use and cessation was strongly advised.  He expressed understanding of risk associated with cocaine use. -Will set the patient up with LifeVest.  Patient educated on the need  for this.  Plan for discharge later today following LifeVest placement.  Will set patient up for follow-up in my office in 1 to 2 weeks.  This patient's plan of care was discussed and created with Dr. Melton Alar and she is in agreement.  Signed: Gale Journey, PA-C  05/19/2023, 10:28 AM [facility] Cardiology

## 2023-05-19 NOTE — Discharge Summary (Signed)
 Physician Discharge Summary   [name] MRN: [id number] DOB: 03/28/1960  Admit date:     05/16/2023  Discharge date: 05/19/23  Discharge Physician: Marcelino Duster   PCP: Patient, No Pcp Per   Recommendations at discharge:   PCP follow up in 1 week. Cardiology follow up as scheduled.  Discharge Diagnoses: Principal Problem:   Acute systolic CHF (congestive heart failure) (HCC) Active Problems:   Acute CHF (HCC)   Myocardial injury   HTN (hypertension)   AKI (acute kidney injury) (HCC)   Tobacco abuse   Cocaine abuse (HCC)  Resolved Problems:   * No resolved hospital problems. *  Hospital Course: Jeffrey Ramsey is a 63 y.o. male with medical history significant of former smoker, cocaine abuse, who presents with shortness of breath.  His BNP is low at 96, was tachycardic, tachypneic upon arrival, chest x-ray showed small layering left pleural effusion, major fissure fluid.  U tox positive for cocaine.  Patient is admitted to hospitalist service for further management evaluation of new CHF exacerbation.   Assessment and Plan: Acute systolic CHF: Continue Lasix 20 mg daily. 2D echocardiogram showed EF 30% Cardiology started on ACEI, beta blocker. Further GDMT decision outpatient depending on his BP, kidney function. Outpatient ischemic evaluation per cardiology team. Life vest arranged, education provided.  Low-salt diet, fluid restriction advised. CHF instructions provided. Advised to quit smoking, cocaine.   Type II MI- demand ischemia in the setting of CHF. Cardiology plan to do ischemic work up as outpatient.   AKI: Kidney function stable on Lasix 20mg  daily, Outpatient follow up with repeat BMP. Further GDMT as per cardiology.   Hypertension: Continue Coreg, Lisinopril.   Polysubstance abuse: Educated regarding ill effects of illicit drugs. Advised to quit smoking and stop using cocaine. Smoking cessation counseled. He understands.        Consultants: Cardiology Procedures performed: none  Disposition: Home Diet recommendation:  Discharge Diet Orders (From admission, onward)     Start     Ordered   05/19/23 0000  Diet - low sodium heart healthy        05/19/23 1234           Cardiac diet DISCHARGE MEDICATION: Allergies as of 05/19/2023       Reactions   Penicillins         Medication List     STOP taking these medications    acetaminophen 325 MG tablet Commonly known as: TYLENOL   methocarbamol 750 MG tablet Commonly known as: ROBAXIN   Oxycodone HCl 10 MG Tabs   polyethylene glycol 17 g packet Commonly known as: MIRALAX / GLYCOLAX       TAKE these medications    aspirin 81 MG chewable tablet Chew 1 tablet (81 mg total) by mouth daily. Start taking on: May 20, 2023   carvedilol 3.125 MG tablet Commonly known as: COREG Take 1 tablet (3.125 mg total) by mouth 2 (two) times daily with a meal.   furosemide 20 MG tablet Commonly known as: LASIX Take 1 tablet (20 mg total) by mouth daily. Start taking on: May 20, 2023   lisinopril 5 MG tablet Commonly known as: ZESTRIL Take 1 tablet (5 mg total) by mouth daily. Start taking on: May 20, 2023        Follow-up Information     Custovic, Rozell [place], [place]. Go in 1 week(s).   Specialty: Cardiology Contact information: [address] [place] [address] [phone]  Discharge Exam: Filed Weights   05/16/23 1535 05/19/23 0500 05/19/23 0840  Weight: 74.8 kg 78.8 kg 65.9 kg      05/19/2023    8:40 AM 05/19/2023    5:00 AM 05/19/2023    4:32 AM  Vitals with BMI  Weight 145 lbs 3 oz 173 lbs 12 oz   BMI 21.43 25.64   Systolic 106  91  Diastolic 79  72  Pulse 88  88   General - Elderly Caucasian male, no apparent in distress. HEENT - PERRLA, EOMI, atraumatic head, non tender sinuses. Lung - Clear, basal rales, no rhonchi, wheezes. Heart - S1, S2 heard, no murmurs, rubs, trace pedal  edema. Abdomen - Soft, non tender, bowel sounds good Neuro - Alert, awake and oriented x 3, non focal exam. Skin - Warm and dry.  Condition at discharge: stable  The results of significant diagnostics from this hospitalization (including imaging, microbiology, ancillary and laboratory) are listed below for reference.   Imaging Studies: ECHOCARDIOGRAM COMPLETE Result Date: 05/17/2023    ECHOCARDIOGRAM REPORT   Patient Name:   Jeffrey Ramsey Date of Exam: 05/17/2023 Medical Rec #:  [id number]     Height:       69.0 in Accession #:    [id number]    Weight:       165.0 lb Date of Birth:  October 11, 1960    BSA:          1.904 m Patient Age:    62 years      BP:           138/74 mmHg Patient Gender: M             HR:           97 bpm. Exam Location:  [facility] Procedure: 2D Echo, Cardiac Doppler and Color Doppler (Both Spectral and Color            Flow Doppler were utilized during procedure). Indications:     CHF- Acute Systolic I50.21  History:         Patient has no prior history of Echocardiogram examinations.                  CHF; Risk Factors:Hypertension and Current Smoker.  Sonographer:     Lucendia Herrlich RCS Referring Phys:  [id number] Brien Few NIU Diagnosing Phys: Yvonne Kendall MD IMPRESSIONS  1. Left ventricular ejection fraction, by estimation, is 25 to 30%. The left ventricle has severely decreased function. The left ventricle demonstrates global hypokinesis. The left ventricular internal cavity size was mildly dilated. Left ventricular diastolic parameters are consistent with Grade II diastolic dysfunction (pseudonormalization).  2. Right ventricular systolic function is moderately reduced. The right ventricular size is mildly enlarged. There is normal pulmonary artery systolic pressure.  3. Left atrial size was mildly dilated.  4. Right atrial size was mildly dilated.  5. The mitral valve is normal in structure. Moderate to severe mitral valve regurgitation.  6. Tricuspid valve regurgitation is mild to moderate.   7. The aortic valve has an indeterminant number of cusps. Aortic valve regurgitation is moderate. No aortic stenosis is present.  8. The inferior vena cava is normal in size with greater than 50% respiratory variability, suggesting right atrial pressure of 3 mmHg. FINDINGS  Left Ventricle: Left ventricular ejection fraction, by estimation, is 25 to 30%. The left ventricle has severely decreased function. The left ventricle demonstrates global hypokinesis. The left ventricular internal cavity size was mildly dilated. There is no  left ventricular hypertrophy. Left ventricular diastolic parameters are consistent with Grade II diastolic dysfunction (pseudonormalization). Right Ventricle: The right ventricular size is mildly enlarged. No increase in right ventricular wall thickness. Right ventricular systolic function is moderately reduced. There is normal pulmonary artery systolic pressure. The tricuspid regurgitant velocity is 2.31 m/s, and with an assumed right atrial pressure of 3 mmHg, the estimated right ventricular systolic pressure is 24.3 mmHg. Left Atrium: Left atrial size was mildly dilated. Right Atrium: Right atrial size was mildly dilated. Pericardium: Trivial pericardial effusion is present. Mitral Valve: The mitral valve is normal in structure. Moderate to severe mitral valve regurgitation. Tricuspid Valve: The tricuspid valve is grossly normal. Tricuspid valve regurgitation is mild to moderate. Aortic Valve: The aortic valve has an indeterminant number of cusps. Aortic valve regurgitation is moderate. Aortic regurgitation PHT measures 379 msec. No aortic stenosis is present. Aortic valve peak gradient measures 2.8 mmHg. Pulmonic Valve: The pulmonic valve was not well visualized. Pulmonic valve regurgitation is not visualized. No evidence of pulmonic stenosis. Aorta: The aortic root is normal in size and structure. Pulmonary Artery: The pulmonary artery is not well seen. Venous: The inferior vena cava is  normal in size with greater than 50% respiratory variability, suggesting right atrial pressure of 3 mmHg. IAS/Shunts: No atrial level shunt detected by color flow Doppler.  LEFT VENTRICLE PLAX 2D LVIDd:         5.75 cm      Diastology LVIDs:         4.78 cm      LV e' medial:    7.51 cm/s LV PW:         0.73 cm      LV E/e' medial:  9.8 LV IVS:        0.64 cm      LV e' lateral:   10.50 cm/s LVOT diam:     2.00 cm      LV E/e' lateral: 7.0 LV SV:         46 LV SV Index:   24 LVOT Area:     3.14 cm  LV Volumes (MOD) LV vol d, MOD A2C: 140.0 ml LV vol d, MOD A4C: 142.0 ml LV vol s, MOD A2C: 79.4 ml LV vol s, MOD A4C: 92.0 ml LV SV MOD A2C:     60.6 ml LV SV MOD A4C:     142.0 ml LV SV MOD BP:      56.0 ml RIGHT VENTRICLE            IVC RV S prime:     7.74 cm/s  IVC diam: 1.94 cm TAPSE (M-mode): 1.2 cm LEFT ATRIUM             Index        RIGHT ATRIUM           Index LA diam:        3.50 cm 1.84 cm/m   RA Area:     22.70 cm LA Vol (A2C):   73.8 ml 38.76 ml/m  RA Volume:   76.70 ml  40.29 ml/m LA Vol (A4C):   62.5 ml 32.83 ml/m LA Biplane Vol: 73.8 ml 38.76 ml/m  AORTIC VALVE AV Area (Vmax): 3.09 cm AV Vmax:        83.85 cm/s AV Peak Grad:   2.8 mmHg LVOT Vmax:      82.40 cm/s LVOT Vmean:     55.100 cm/s LVOT VTI:  0.145 m AI PHT:         379 msec  AORTA Ao Root diam: 3.60 cm MITRAL VALVE               TRICUSPID VALVE MV Area (PHT): 5.97 cm    TR Peak grad:   21.3 mmHg MV Decel Time: 127 msec    TR Vmax:        231.00 cm/s MR Peak grad: 81.7 mmHg MR Vmax:      452.00 cm/s  SHUNTS MV E velocity: 73.70 cm/s  Systemic VTI:  0.14 m MV A velocity: 49.50 cm/s  Systemic Diam: 2.00 cm MV E/A ratio:  1.49 Yvonne Kendall MD Electronically signed by Yvonne Kendall MD Signature Date/Time: 05/17/2023/5:18:54 PM    Final    DG Chest 2 View Result Date: 05/16/2023 CLINICAL DATA:  Shortness of breath EXAM: CHEST - 2 VIEW COMPARISON:  Chest x-ray 08/14/2020 FINDINGS: There is peribronchial wall thickening in the hilar  regions bilaterally. There is new dense focal opacity along the major fissure seen on the lateral view, possibly loculated fluid. The cardiomediastinal silhouette is within normal limits. There is a small layering left pleural effusion. There is no pneumothorax or acute fracture. IMPRESSION: 1. New dense focal opacity along the major fissure seen on the lateral view, possibly loculated fluid. Recommend further evaluation with CT. 2. Small layering left pleural effusion. 3. Peribronchial wall thickening in the hilar regions bilaterally, likely infectious/inflammatory. Electronically Signed   By: Darliss Cheney M.D.   On: 05/16/2023 18:45    Microbiology: Results for orders placed or performed during the hospital encounter of 05/16/23  SARS Coronavirus 2 by RT PCR (hospital order, performed in [facility] hospital lab) *cepheid single result test* Anterior Nasal Swab     Status: None   Collection Time: 05/16/23  7:41 PM   Specimen: Anterior Nasal Swab  Result Value Ref Range Status   SARS Coronavirus 2 by RT PCR NEGATIVE NEGATIVE Final    Comment: (NOTE) SARS-CoV-2 target nucleic acids are NOT DETECTED.  The SARS-CoV-2 RNA is generally detectable in upper and lower respiratory specimens during the acute phase of infection. The lowest concentration of SARS-CoV-2 viral copies this assay can detect is 250 copies / mL. A negative result does not preclude SARS-CoV-2 infection and should not be used as the sole basis for treatment or other patient management decisions.  A negative result may occur with improper specimen collection / handling, submission of specimen other than nasopharyngeal swab, presence of viral mutation(s) within the areas targeted by this assay, and inadequate number of viral copies (<250 copies / mL). A negative result must be combined with clinical observations, patient history, and epidemiological information.  Fact Sheet for Patients:    RoadLapTop.co.za  Fact Sheet for Healthcare Providers: http://kim-miller.com/  This test is not yet approved or  cleared by the [place] FDA and has been authorized for detection and/or diagnosis of SARS-CoV-2 by FDA under an Emergency Use Authorization (EUA).  This EUA will remain in effect (meaning this test can be used) for the duration of the COVID-19 declaration under Section 564(b)(1) of the Act, 21 U.S.C. section 360bbb-3(b)(1), unless the authorization is terminated or revoked sooner.  Performed at [facility], [address] [place], [place] [address]     Labs: CBC: Recent Labs  Lab 05/16/23 1538 05/18/23 0633 05/19/23 0544  WBC 8.5 9.0 7.3  NEUTROABS 6.2  --   --   HGB 14.2 17.9* 17.0  HCT 43.2 52.0 49.8  MCV 92.7 87.1 87.4  PLT 246 274 268   Basic Metabolic Panel: Recent Labs  Lab 05/16/23 1538 05/16/23 2057 05/17/23 0215 05/18/23 0633 05/19/23 0544  NA 139  --  137 136 135  K 4.5  --  3.7 4.3 4.6  CL 110  --  106 103 104  CO2 24  --  22 23 23   GLUCOSE 92  --  91 121* 108*  BUN 27*  --  26* 37* 43*  CREATININE 1.34*  --  1.27* 1.53* 1.59*  CALCIUM 8.6*  --  8.5* 9.0 8.6*  MG  --  2.1  --  2.2  --    Liver Function Tests: Recent Labs  Lab 05/16/23 1538  AST 29  ALT 69*  ALKPHOS 101  BILITOT 0.7  PROT 6.6  ALBUMIN 3.6   CBG: No results for input(s): "GLUCAP" in the last 168 hours.  Discharge time spent: 36 minutes.  Signed: Marcelino Duster, MD [facility] 05/19/2023

## 2023-05-19 NOTE — Progress Notes (Signed)
 Heart Failure Stewardship Pharmacy Note  PCP: Patient, No Pcp Per PCP-Cardiologist: None  HPI: Jeffrey Ramsey is a 63 y.o. male with hypertension cocaine use who presented with shortness of breath for the past 2 months, worsening several days prior to admission.  On admission, BNP was 1196.1, HS-troponin was 28 > 33 > 40, and UDS was positive for cocaine. Chest x-ray noted small layering pleural effusion and peribronchial wall thickening likely infectious/inflammatory. Echocardiogram this admission noted LVEF to be 25-30% with grade II diastolic dysfunction, moderately reduced RV function, mildly dilated atria, moderate to severe MR, moderate AR.  Pertinent Lab Values: Creatinine, Ser  Date Value Ref Range Status  05/19/2023 1.59 (H) 0.61 - 1.24 mg/dL Final   BUN  Date Value Ref Range Status  05/19/2023 43 (H) 8 - 23 mg/dL Final   Potassium  Date Value Ref Range Status  05/19/2023 4.6 3.5 - 5.1 mmol/L Final   Sodium  Date Value Ref Range Status  05/19/2023 135 135 - 145 mmol/L Final   B Natriuretic Peptide  Date Value Ref Range Status  05/16/2023 1,196.1 (H) 0.0 - 100.0 pg/mL Final    Comment:    Performed at [facility], [address] [place], [place] [address]   Magnesium  Date Value Ref Range Status  05/18/2023 2.2 1.7 - 2.4 mg/dL Final    Comment:    Performed at [facility], [address] [place], [place] [address]   Hgb A1c MFr Bld  Date Value Ref Range Status  05/16/2023 5.8 (H) 4.8 - 5.6 % Final    Comment:    (NOTE) Pre diabetes:          5.7%-6.4%  Diabetes:              >6.4%  Glycemic control for   <7.0% adults with diabetes     Vital Signs:  Temp:  [97.6 F (36.4 C)-98.4 F (36.9 C)] 97.8 F (36.6 C) (03/13 0840) Pulse Rate:  [88-108] 88 (03/13 0840) Cardiac Rhythm: Normal sinus rhythm (03/13 0840) Resp:  [16-18] 16 (03/13 0840) BP: (91-106)/(68-79) 106/79 (03/13 0840) SpO2:  [94 %-97 %] 97 % (03/13 0840) Weight:   [65.9 kg (145 lb 3.2 oz)-78.8 kg (173 lb 11.6 oz)] 65.9 kg (145 lb 3.2 oz) (03/13 0840)  Intake/Output Summary (Last 24 hours) at 05/19/2023 1203 Last data filed at 05/19/2023 1133 Gross per 24 hour  Intake 360 ml  Output --  Net 360 ml    Current Heart Failure Medications:  Loop diuretic: furosemide 20 mg daily Beta-Blocker: carvedilol 3.125 mg BID ACEI/ARB/ARNI: lisinopril 5 mg daily MRA: none SGLT2i: none Other: none  Prior to admission Heart Failure Medications:  Loop diuretic: none Beta-Blocker: none ACEI/ARB/ARNI: none MRA: none SGLT2i: none Other: none  Assessment: 1. Acute systolic heart failure (LVEF 25-30%) with grade II diastolic dysfunction, due to presumed NICM. NYHA class II symptoms.  -Symptoms: Reports feeling back to baseline. -Volume: Appears euvolemic or slightly hypovolemic. Creatinine/BUN are continuing to trend up slightly, despite swapping to oral diuretics. Suspect this is due to being somewhat dry. Consider holding the next dose of furosemide, then resuming 20 mg at prn on discharge. -Hemodynamics: BP is stable at the lower limit of normal. HR 80-90s. -BB: Continue low dose carvedilol 3.125 mg BID. -ACEI/ARB/ARNI: Continue lisinopril 5 mg daily. -MRA: Would consider adding spironolactone 12.5 mg outpatient if creatinine & K improve.  -SGLT2i: Would require patient assistance for new start SGLT2i.   Plan: 1) Medication  changes recommended at this time: - Consider holding the next furosemide dose.   2) Patient assistance: -Patient is uninsured. Would likely qualify for patient assistance if required for SGLT2i. Please reach out if decision is made to start while inpatient.  3) Education: - Patient has been educated on current HF medications and potential additions to HF medication regimen - Patient verbalizes understanding that over the next few months, these medication doses may change and more medications may be added to optimize HF regimen -  Patient has been educated on basic disease state pathophysiology and goals of therapy  Medication Assistance / Insurance Benefits Check: Does the patient have prescription insurance?    Type of insurance plan:  Does the patient qualify for medication assistance through manufacturers or grants? Pending  Eligible grants and/or patient assistance programs: pending  Medication assistance applications in progress: none  Medication assistance applications approved: none Approved medication assistance renewals will be completed by: [facility]  Outpatient Pharmacy: Prior to admission outpatient pharmacy: none   Is the patient willing to utilize a [facility] pharmacy at discharge?: Yes  Please do not hesitate to reach out with questions or concerns,  Enos Fling, PharmD, CPP, BCPS Heart Failure Pharmacist  Phone - [phone] 05/19/2023 12:03 PM

## 2023-05-19 NOTE — Plan of Care (Signed)
   Problem: Education: Goal: Knowledge of General Education information will improve Description Including pain rating scale, medication(s)/side effects and non-pharmacologic comfort measures Outcome: Progressing
